# Patient Record
Sex: Female | Born: 1955 | Race: White | Hispanic: No | Marital: Married | Smoking: Never smoker
Health system: Southern US, Community
[De-identification: ages and names within clinical notes are randomized; demographics above are authoritative.]

---

## 2009-06-19 ENCOUNTER — Inpatient Hospital Stay: Payer: Self-pay | Admitting: *Deleted

## 2016-08-22 ENCOUNTER — Emergency Department: Payer: BLUE CROSS/BLUE SHIELD

## 2016-08-22 ENCOUNTER — Inpatient Hospital Stay
Admission: EM | Admit: 2016-08-22 | Discharge: 2016-08-25 | DRG: 066 | Disposition: A | Discharge status: Home / Self Care | Payer: BLUE CROSS/BLUE SHIELD | Attending: Internal Medicine | Admitting: Internal Medicine

## 2016-08-22 DIAGNOSIS — I10 Essential (primary) hypertension: Secondary | ICD-10-CM | POA: Diagnosis present

## 2016-08-22 DIAGNOSIS — R531 Weakness: Secondary | ICD-10-CM | POA: Diagnosis present

## 2016-08-22 DIAGNOSIS — E785 Hyperlipidemia, unspecified: Secondary | ICD-10-CM | POA: Diagnosis present

## 2016-08-22 DIAGNOSIS — Z823 Family history of stroke: Secondary | ICD-10-CM | POA: Diagnosis not present

## 2016-08-22 DIAGNOSIS — R29700 NIHSS score 0: Secondary | ICD-10-CM | POA: Diagnosis present

## 2016-08-22 DIAGNOSIS — Z885 Allergy status to narcotic agent status: Secondary | ICD-10-CM | POA: Diagnosis not present

## 2016-08-22 DIAGNOSIS — I959 Hypotension, unspecified: Secondary | ICD-10-CM | POA: Diagnosis present

## 2016-08-22 DIAGNOSIS — Z7982 Long term (current) use of aspirin: Secondary | ICD-10-CM | POA: Diagnosis not present

## 2016-08-22 DIAGNOSIS — G8324 Monoplegia of upper limb affecting left nondominant side: Secondary | ICD-10-CM | POA: Diagnosis present

## 2016-08-22 DIAGNOSIS — M6281 Muscle weakness (generalized): Secondary | ICD-10-CM

## 2016-08-22 DIAGNOSIS — I639 Cerebral infarction, unspecified: Secondary | ICD-10-CM | POA: Diagnosis not present

## 2016-08-22 DIAGNOSIS — D72829 Elevated white blood cell count, unspecified: Secondary | ICD-10-CM | POA: Diagnosis present

## 2016-08-22 DIAGNOSIS — G459 Transient cerebral ischemic attack, unspecified: Secondary | ICD-10-CM

## 2016-08-22 DIAGNOSIS — I63411 Cerebral infarction due to embolism of right middle cerebral artery: Principal | ICD-10-CM | POA: Diagnosis present

## 2016-08-22 DIAGNOSIS — Z8249 Family history of ischemic heart disease and other diseases of the circulatory system: Secondary | ICD-10-CM | POA: Diagnosis not present

## 2016-08-22 DIAGNOSIS — Z8262 Family history of osteoporosis: Secondary | ICD-10-CM

## 2016-08-22 DIAGNOSIS — R739 Hyperglycemia, unspecified: Secondary | ICD-10-CM | POA: Diagnosis present

## 2016-08-22 DIAGNOSIS — E876 Hypokalemia: Secondary | ICD-10-CM | POA: Diagnosis present

## 2016-08-22 LAB — COMPREHENSIVE METABOLIC PANEL
ALT: 12 U/L — AB (ref 14–54)
AST: 19 U/L (ref 15–41)
Albumin: 4 g/dL (ref 3.5–5.0)
Alkaline Phosphatase: 68 U/L (ref 38–126)
Anion gap: 9 (ref 5–15)
BUN: 15 mg/dL (ref 6–20)
CHLORIDE: 103 mmol/L (ref 101–111)
CO2: 23 mmol/L (ref 22–32)
CREATININE: 0.87 mg/dL (ref 0.44–1.00)
Calcium: 9.1 mg/dL (ref 8.9–10.3)
Glucose, Bld: 136 mg/dL — ABNORMAL HIGH (ref 65–99)
POTASSIUM: 3.2 mmol/L — AB (ref 3.5–5.1)
Sodium: 135 mmol/L (ref 135–145)
TOTAL PROTEIN: 7.7 g/dL (ref 6.5–8.1)
Total Bilirubin: 1 mg/dL (ref 0.3–1.2)

## 2016-08-22 LAB — CBC WITH DIFFERENTIAL/PLATELET
BASOS ABS: 0.1 10*3/uL (ref 0–0.1)
Basophils Relative: 1 %
EOS ABS: 0.1 10*3/uL (ref 0–0.7)
Eosinophils Relative: 1 %
HCT: 36.8 % (ref 35.0–47.0)
HEMOGLOBIN: 12.6 g/dL (ref 12.0–16.0)
LYMPHS ABS: 2.2 10*3/uL (ref 1.0–3.6)
Lymphocytes Relative: 17 %
MCH: 29 pg (ref 26.0–34.0)
MCHC: 34.2 g/dL (ref 32.0–36.0)
MCV: 84.6 fL (ref 80.0–100.0)
Monocytes Absolute: 1.1 10*3/uL — ABNORMAL HIGH (ref 0.2–0.9)
Monocytes Relative: 9 %
NEUTROS PCT: 72 %
Neutro Abs: 9.1 10*3/uL — ABNORMAL HIGH (ref 1.4–6.5)
Platelets: 246 10*3/uL (ref 150–440)
RBC: 4.35 MIL/uL (ref 3.80–5.20)
RDW: 13.3 % (ref 11.5–14.5)
WBC: 12.6 10*3/uL — AB (ref 3.6–11.0)

## 2016-08-22 LAB — TROPONIN I
TROPONIN I: 1.02 ng/mL — AB (ref <0.03)
Troponin I: 0.03 ng/mL (ref <0.03)
Troponin I: 0.3 ng/mL (ref <0.03)
Troponin I: 0.89 ng/mL (ref <0.03)

## 2016-08-22 LAB — APTT: APTT: 31 s (ref 24–36)

## 2016-08-22 LAB — URINALYSIS, ROUTINE W REFLEX MICROSCOPIC
BILIRUBIN URINE: NEGATIVE
Glucose, UA: NEGATIVE mg/dL
HGB URINE DIPSTICK: NEGATIVE
KETONES UR: 20 mg/dL — AB
Leukocytes, UA: NEGATIVE
NITRITE: NEGATIVE
PROTEIN: NEGATIVE mg/dL
SPECIFIC GRAVITY, URINE: 1.02 (ref 1.005–1.030)
pH: 5 (ref 5.0–8.0)

## 2016-08-22 LAB — URINE DRUG SCREEN, QUALITATIVE (ARMC ONLY)
Amphetamines, Ur Screen: NOT DETECTED
BARBITURATES, UR SCREEN: NOT DETECTED
BENZODIAZEPINE, UR SCRN: NOT DETECTED
CANNABINOID 50 NG, UR ~~LOC~~: NOT DETECTED
Cocaine Metabolite,Ur ~~LOC~~: NOT DETECTED
MDMA (Ecstasy)Ur Screen: NOT DETECTED
METHADONE SCREEN, URINE: NOT DETECTED
Opiate, Ur Screen: NOT DETECTED
Phencyclidine (PCP) Ur S: NOT DETECTED
TRICYCLIC, UR SCREEN: NOT DETECTED

## 2016-08-22 LAB — TSH: TSH: 2.233 u[IU]/mL (ref 0.350–4.500)

## 2016-08-22 LAB — PROTIME-INR
INR: 1.08
PROTHROMBIN TIME: 14 s (ref 11.4–15.2)

## 2016-08-22 MED ORDER — CLOPIDOGREL BISULFATE 75 MG PO TABS
75.0000 mg | ORAL_TABLET | Freq: Every day | ORAL | Status: DC
  Administered 2016-08-22 – 2016-08-25 (×4): 75 mg via ORAL
  Filled 2016-08-22 (×4): qty 1

## 2016-08-22 MED ORDER — ASPIRIN 81 MG PO CHEW
324.0000 mg | CHEWABLE_TABLET | Freq: Once | ORAL | Status: AC
  Administered 2016-08-22: 324 mg via ORAL
  Filled 2016-08-22: qty 4

## 2016-08-22 MED ORDER — ENOXAPARIN SODIUM 40 MG/0.4ML ~~LOC~~ SOLN
40.0000 mg | SUBCUTANEOUS | Status: DC
  Administered 2016-08-22: 40 mg via SUBCUTANEOUS
  Filled 2016-08-22 (×3): qty 0.4

## 2016-08-22 MED ORDER — VITAMIN D 1000 UNITS PO TABS
2000.0000 [IU] | ORAL_TABLET | Freq: Every day | ORAL | Status: DC
  Administered 2016-08-22 – 2016-08-25 (×4): 2000 [IU] via ORAL
  Filled 2016-08-22 (×4): qty 2

## 2016-08-22 MED ORDER — METOPROLOL TARTRATE 25 MG PO TABS
12.5000 mg | ORAL_TABLET | Freq: Two times a day (BID) | ORAL | Status: DC
  Administered 2016-08-22 – 2016-08-24 (×6): 12.5 mg via ORAL
  Filled 2016-08-22 (×7): qty 1

## 2016-08-22 MED ORDER — POTASSIUM CHLORIDE IN NACL 20-0.9 MEQ/L-% IV SOLN
INTRAVENOUS | Status: DC
  Administered 2016-08-22 – 2016-08-23 (×3): via INTRAVENOUS
  Filled 2016-08-22 (×5): qty 1000

## 2016-08-22 MED ORDER — ONDANSETRON HCL 4 MG PO TABS: 4.0000 mg | ORAL_TABLET | Freq: Four times a day (QID) | ORAL | Status: DC | PRN

## 2016-08-22 MED ORDER — ONDANSETRON HCL 4 MG/2ML IJ SOLN: 4.0000 mg | Freq: Four times a day (QID) | INTRAMUSCULAR | Status: DC | PRN

## 2016-08-22 MED ORDER — OXYCODONE-ACETAMINOPHEN 5-325 MG PO TABS
ORAL_TABLET | ORAL | Status: AC
  Filled 2016-08-22: qty 1

## 2016-08-22 MED ORDER — ASPIRIN EC 325 MG PO TBEC
325.0000 mg | DELAYED_RELEASE_TABLET | Freq: Every day | ORAL | Status: DC
  Administered 2016-08-23 – 2016-08-25 (×3): 325 mg via ORAL
  Filled 2016-08-22 (×3): qty 1

## 2016-08-22 MED ORDER — ACETAMINOPHEN 325 MG PO TABS
650.0000 mg | ORAL_TABLET | Freq: Once | ORAL | Status: AC
  Administered 2016-08-22: 650 mg via ORAL
  Filled 2016-08-22: qty 2

## 2016-08-22 MED ORDER — ATORVASTATIN CALCIUM 20 MG PO TABS
40.0000 mg | ORAL_TABLET | Freq: Every day | ORAL | Status: DC
  Administered 2016-08-23 – 2016-08-24 (×2): 40 mg via ORAL
  Filled 2016-08-22 (×2): qty 2

## 2016-08-22 MED ORDER — DOCUSATE SODIUM 100 MG PO CAPS
100.0000 mg | ORAL_CAPSULE | Freq: Two times a day (BID) | ORAL | Status: DC
  Administered 2016-08-23 – 2016-08-25 (×4): 100 mg via ORAL
  Filled 2016-08-22 (×5): qty 1

## 2016-08-22 MED ORDER — SODIUM CHLORIDE 0.9 % IV SOLN
1000.0000 mL | Freq: Once | INTRAVENOUS | Status: AC
  Administered 2016-08-22: 1000 mL via INTRAVENOUS

## 2016-08-22 MED ORDER — SODIUM CHLORIDE 0.9% FLUSH
3.0000 mL | Freq: Two times a day (BID) | INTRAVENOUS | Status: DC
  Administered 2016-08-22 – 2016-08-25 (×5): 3 mL via INTRAVENOUS

## 2016-08-22 NOTE — ED Notes (Signed)
Patient assisted to use restroom. Able to ambulate without one assist. Steady gait.

## 2016-08-22 NOTE — H&P (Signed)
[facility] - [place] at [facility]   PATIENT NAME: Sophia Taylor    MR#:  [id number]  DATE OF BIRTH:  October 24, 1955  DATE OF ADMISSION:  08/22/2016  PRIMARY CARE PHYSICIAN: Doristine Bosworth, MD   REQUESTING/REFERRING PHYSICIAN:   CHIEF COMPLAINT:   Chief Complaint  Patient presents with  . Extremity Weakness  . Code Stroke    HISTORY OF PRESENT ILLNESS: Sophia Taylor  is a 61 y.o. female with a known history of Stroke with left lower extremity weakness and just on 10, which completely resolved, on aspirin therapy, hypertension, hyperlipidemia, who presents to the hospital with complaints of left upper extremity weakness and numbness. I went to the patient she was doing well early in the morning. She was about to go to the shower, however, while walking into the shower stall. She noted that her left upper extremity is not moving well. He felt weak and numb. Patient denied any visual symptoms. No dysphagia, no facial or left lower extremity numbness or weakness. She presented to the hospital for further evaluation and treatment, she continues to have some left upper extremity weakness. Hospitalist services were contacted for admission for stroke. Patient was seen by neurologist, who recommended to continue aspirin and Plavix therapy, get MRI, MRA of the brain and other studies. CT scan of the head in the emergency room was unremarkable  PAST MEDICAL HISTORY:  Hypertension, hyperlipidemia, stroke with no residuals, on aspirin therapy, ganglion  PAST SURGICAL HISTORY: Hernia repair in 2008, ventral hernia, right lower quadrant tumor in 2007, benign  SOCIAL HISTORY:  Social History  Substance Use Topics  . Smoking status: Never Smoker  . Smokeless tobacco: Never Used  . Alcohol use No    FAMILY HISTORY: No early coronary artery disease .  DRUG ALLERGIES:  Allergies  Allergen Reactions  . Codeine Nausea And Vomiting    Review of Systems  Constitutional:  Negative for chills, fever and weight loss.  HENT: Negative for congestion.   Eyes: Negative for blurred vision and double vision.  Respiratory: Negative for cough, sputum production, shortness of breath and wheezing.   Cardiovascular: Negative for chest pain, palpitations, orthopnea, leg swelling and PND.  Gastrointestinal: Negative for abdominal pain, blood in stool, constipation, diarrhea, nausea and vomiting.  Genitourinary: Negative for dysuria, frequency, hematuria and urgency.  Musculoskeletal: Negative for falls.  Neurological: Positive for sensory change and focal weakness. Negative for dizziness, tremors and headaches.  Endo/Heme/Allergies: Does not bruise/bleed easily.  Psychiatric/Behavioral: Negative for depression. The patient does not have insomnia.     MEDICATIONS AT HOME:  Prior to Admission medications   Medication Sig Start Date End Date Taking? Authorizing Provider  amLODipine (NORVASC) 10 MG tablet Take 10 mg by mouth daily.   Yes Historical Provider, MD  aspirin EC 81 MG tablet Take 81 mg by mouth daily.   Yes Historical Provider, MD  Cholecalciferol (VITAMIN D) 2000 units CAPS Take 2,000 Units by mouth daily.   Yes Historical Provider, MD  lisinopril (PRINIVIL,ZESTRIL) 10 MG tablet Take 10 mg by mouth daily.   Yes Historical Provider, MD      PHYSICAL EXAMINATION:   VITAL SIGNS: Pulse (!) 108, temperature 98.2 F (36.8 C), height 4\' 11"  (1.499 m), weight 68 kg (150 lb).  GENERAL:  61 y.o.-year-old patient lying in the bed with no acute distress.  EYES: Pupils equal, round, reactive to light and accommodation. No scleral icterus. Extraocular muscles intact.  HEENT: Head atraumatic, normocephalic. Oropharynx and nasopharynx  clear.  NECK:  Supple, no jugular venous distention. No thyroid enlargement, no tenderness.  LUNGS: Normal breath sounds bilaterally, no wheezing, rales,rhonchi or crepitation. No use of accessory muscles of respiration.  CARDIOVASCULAR: S1,  S2 normal. No murmurs, rubs, or gallops.  ABDOMEN: Soft, nontender, nondistended. Bowel sounds present. No organomegaly or mass.  EXTREMITIES: No pedal edema, cyanosis, or clubbing.  NEUROLOGIC: Cranial nerves II through XII are intact. Muscle strength 5/5 in 3 extremities, but left upper extremity which is 4 out of 5. Sensation intact. Gait not checked.  PSYCHIATRIC: The patient is alert and oriented x 3.  SKIN: No obvious rash, lesion, or ulcer.   LABORATORY PANEL:   CBC  Recent Labs Lab 08/22/16 1025  WBC 12.6*  HGB 12.6  HCT 36.8  PLT 246  MCV 84.6  MCH 29.0  MCHC 34.2  RDW 13.3  LYMPHSABS 2.2  MONOABS 1.1*  EOSABS 0.1  BASOSABS 0.1   ------------------------------------------------------------------------------------------------------------------  Chemistries   Recent Labs Lab 08/22/16 1025  NA 135  K 3.2*  CL 103  CO2 23  GLUCOSE 136*  BUN 15  CREATININE 0.87  CALCIUM 9.1  AST 19  ALT 12*  ALKPHOS 68  BILITOT 1.0   ------------------------------------------------------------------------------------------------------------------  Cardiac Enzymes  Recent Labs Lab 08/22/16 1025  TROPONINI 0.03*   ------------------------------------------------------------------------------------------------------------------  RADIOLOGY: Ct Head Wo Contrast  Result Date: 08/22/2016 CLINICAL DATA:  Code stroke.  Left arm weakness EXAM: CT HEAD WITHOUT CONTRAST TECHNIQUE: Contiguous axial images were obtained from the base of the skull through the vertex without intravenous contrast. COMPARISON:  06/19/2009 FINDINGS: Brain: No evidence of acute infarction, hemorrhage, hydrocephalus, extra-axial collection or mass lesion/mass effect. Mild white matter disease without detectable progression since prior. Superficial high-density along the ventral lower pons is attributed artifact based on reformats. Vascular: No hyperdense vessel. Atherosclerotic calcification. Tortuous  vertebrobasilar system. Skull: Negative Sinuses/Orbits: Negative Other: These results were called by telephone at the time of interpretation on 08/22/2016 at 10:38 am to Dr. Jene Every , who verbally acknowledged these results. ASPECTS [facility] Stroke Program Early CT Score) - Ganglionic level infarction (caudate, lentiform nuclei, internal capsule, insula, M1-M3 cortex): 7 - Supraganglionic infarction (M4-M6 cortex): 3 Total score (0-10 with 10 being normal): 10 IMPRESSION: No acute finding. ASPECTS is 10. Electronically Signed   By: Marnee Spring M.D.   On: 08/22/2016 10:41    EKG: Orders placed or performed during the hospital encounter of 08/22/16  . ED EKG  . ED EKG  . EKG 12-Lead  . EKG 12-Lead  . EKG 12-Lead  . EKG 12-Lead  EKG in emergency room revealed sinus tachycardia at rate of 109, normal axis, no acute ST-T changes  IMPRESSION AND PLAN:  Active Problems:   CVA (cerebral vascular accident) (HCC)  #1. Left upper extremity weakness due to acute stroke, admit the patient to the  medical floor, appreciate neurology's input, continue aspirin and Plavix, get MRI, MRA of the brain, carotid ultrasound, echocardiogram, TSH, lipid panel, hemoglobin A1c, PT, OT, SLP evaluations #2. Relative hypotension, holding blood pressure medications #3. Hyperlipidemia, change Zocor to Lipitor, check lipid panel in the morning #4. Hypokalemia, supplement intravenously, get magnesium level checked #5. Elevated troponin, unremarkable EKG, get echocardiogram, initiate patient on low dose of metoprolol, continue aspirin and Plavix and Lipitor, may need to get cardiologist involved, no chest pains were reported #6. Leukocytosis, likely stress related, no obvious infection #7. Hyperglycemia, get hemoglobin A1c to rule out diabetes   All the records are reviewed and  case discussed with ED provider. Management plans discussed with the patient, family and they are in agreement.  CODE STATUS: Code  Status History    This patient does not have a recorded code status. Please follow your organizational policy for patients in this situation.       TOTAL TIME TAKING CARE OF THIS PATIENT: 50 minutes.    Katharina CaperVAICKUTE,Jewelene Mairena M.D on 08/22/2016 at 1:36 PM  Between 7am to 6pm - Pager - [phone] After 6pm go to [email or web address] - password [email or web address]  [facility]  Office  [phone]  CC: Primary care physician; Doristine BosworthMark D Miller, MD

## 2016-08-22 NOTE — ED Triage Notes (Signed)
Pt arrives via [facility] from home with reports of left arm "heaviness"  Pt reports awakening like normal today and feeling herself but then she got into the shower and she began having left arm heaviness  Hx CVA

## 2016-08-22 NOTE — Progress Notes (Signed)
CH responding to a PG visited a code stroke Pt in Rm19. Pt had been take to CT scan, but the Husband and son were waiting int he Rm. CH talked to the family members and asked them if they needed anything, they said no. CH told the family he is available if needed to provide pastoral care.     08/22/16 1300  Clinical Encounter Type  Visited With Patient;Patient and family together  Visit Type Initial;Spiritual support;Trauma;Other (Comment)  Referral From Nurse  Consult/Referral To Chaplain  Spiritual Encounters  Spiritual Needs Prayer;Other (Comment)

## 2016-08-22 NOTE — Consult Note (Signed)
Referring Physician: Cyril Loosen    Chief Complaint: Left sided weakness  HPI: Sophia Taylor is an 61 y.o. female who repots awakening at baseline today.  Sophia Taylor to take a shower and while in the shower began to noted heaviness and incoordination of her left side.  Patient continued to be able to walk and was able to get out of the shower without assistance.  Noted some dizziness as well.  Patient presented for evaluation.  Has a history of stroke on ASA.   Initial NIHSS of 0.  No residual deficits from prior infarct.    Date last known well: Date: 08/22/2016 Time last known well: Time: 09:00 tPA Given: No: Improving, minor symptoms  Past medical history: HTN, hyperlipidemia, stroke (2010)  Past surgical history: hernia repair, RLQ tumor removal  Family history: Father deceased with a stroke.  Mother with CAD s/p MI and osteoporosis  Social History:  reports that she has never smoked. She has never used smokeless tobacco. She reports that she does not drink alcohol. Her drug history is not on file.  Allergies:  Allergies  Allergen Reactions  . Codeine Nausea And Vomiting    Medications: I have reviewed the patient's current medications. Prior to Admission:  Prior to Admission medications   Medication Sig Start Date End Date Taking? Authorizing Provider  amLODipine (NORVASC) 10 MG tablet Take 10 mg by mouth daily.   Yes Historical Provider, MD  aspirin EC 81 MG tablet Take 81 mg by mouth daily.   Yes Historical Provider, MD  Cholecalciferol (VITAMIN D) 2000 units CAPS Take 2,000 Units by mouth daily.   Yes Historical Provider, MD  lisinopril (PRINIVIL,ZESTRIL) 10 MG tablet Take 10 mg by mouth daily.   Yes Historical Provider, MD     ROS: History obtained from the patient  General ROS: negative for - chills, fatigue, fever, night sweats, weight gain or weight loss Psychological ROS: negative for - behavioral disorder, hallucinations, memory difficulties, mood swings or suicidal  ideation Ophthalmic ROS: negative for - blurry vision, double vision, eye pain or loss of vision ENT ROS: negative for - epistaxis, nasal discharge, oral lesions, sore throat, tinnitus or vertigo Allergy and Immunology ROS: negative for - hives or itchy/watery eyes Hematological and Lymphatic ROS: negative for - bleeding problems, bruising or swollen lymph nodes Endocrine ROS: negative for - galactorrhea, hair pattern changes, polydipsia/polyuria or temperature intolerance Respiratory ROS: negative for - cough, hemoptysis, shortness of breath or wheezing Cardiovascular ROS: negative for - chest pain, dyspnea on exertion, edema or irregular heartbeat Gastrointestinal ROS: negative for - abdominal pain, diarrhea, hematemesis, nausea/vomiting or stool incontinence Genito-Urinary ROS: negative for - dysuria, hematuria, incontinence or urinary frequency/urgency Musculoskeletal ROS: negative for - joint swelling or muscular weakness Neurological ROS: as noted in HPI Dermatological ROS: negative for rash and skin lesion changes  Physical Examination: Pulse (!) 108, temperature 98.2 F (36.8 C), height 4\' 11"  (1.499 m), weight 68 kg (150 lb).  HEENT-  Normocephalic, no lesions, without obvious abnormality.  Normal external eye and conjunctiva.  Normal TM's bilaterally.  Normal auditory canals and external ears. Normal external nose, mucus membranes and septum.  Normal pharynx. Cardiovascular- S1, S2 normal, pulses palpable throughout   Lungs- chest clear, no wheezing, rales, normal symmetric air entry Abdomen- soft, non-tender; bowel sounds normal; no masses,  no organomegaly Extremities- no edema Lymph-no adenopathy palpable Musculoskeletal-no joint tenderness, deformity or swelling Skin-warm and dry, no hyperpigmentation, vitiligo, or suspicious lesions  Neurological Examination Mental Status: Alert, oriented,  thought content appropriate.  Speech fluent without evidence of aphasia.  Able to  follow 3 step commands without difficulty. Cranial Nerves: II: Discs flat bilaterally; Visual fields grossly normal, pupils equal, round, reactive to light and accommodation III,IV, VI: ptosis not present, extra-ocular motions intact bilaterally V,VII: smile symmetric, facial light touch sensation normal bilaterally VIII: hearing normal bilaterally IX,X: gag reflex present XI: bilateral shoulder shrug XII: midline tongue extension Motor: Right : Upper extremity   5/5    Left:     Upper extremity   5/5  Lower extremity   5/5     Lower extremity   5/5 Tone and bulk:normal tone throughout; no atrophy noted Sensory: Pinprick and light touch intact throughout, bilaterally Deep Tendon Reflexes: 2+ and symmetric with absent AJ's bilaterally Plantars: Right: downgoing   Left: downgoing Cerebellar: Normal finger-to-nose and normal heel-to-shin testing bilaterally Gait: not tested due to safety concerns    Laboratory Studies:  Basic Metabolic Panel:  Recent Labs Lab 08/22/16 1025  NA 135  K 3.2*  CL 103  CO2 23  GLUCOSE 136*  BUN 15  CREATININE 0.87  CALCIUM 9.1    Liver Function Tests:  Recent Labs Lab 08/22/16 1025  AST 19  ALT 12*  ALKPHOS 68  BILITOT 1.0  PROT 7.7  ALBUMIN 4.0   No results for input(s): LIPASE, AMYLASE in the last 168 hours. No results for input(s): AMMONIA in the last 168 hours.  CBC:  Recent Labs Lab 08/22/16 1025  WBC 12.6*  NEUTROABS 9.1*  HGB 12.6  HCT 36.8  MCV 84.6  PLT 246    Cardiac Enzymes:  Recent Labs Lab 08/22/16 1025  TROPONINI 0.03*    BNP: Invalid input(s): POCBNP  CBG: No results for input(s): GLUCAP in the last 168 hours.  Microbiology: No results found for this or any previous visit.  Coagulation Studies:  Recent Labs  08/22/16 1025  LABPROT 14.0  INR 1.08    Urinalysis:  Recent Labs Lab 08/22/16 1204  COLORURINE YELLOW*  LABSPEC 1.020  PHURINE 5.0  GLUCOSEU NEGATIVE  HGBUR NEGATIVE   BILIRUBINUR NEGATIVE  KETONESUR 20*  PROTEINUR NEGATIVE  NITRITE NEGATIVE  LEUKOCYTESUR NEGATIVE    Lipid Panel: No results found for: CHOL, TRIG, HDL, CHOLHDL, VLDL, LDLCALC  HgbA1C: No results found for: HGBA1C  Urine Drug Screen:     Component Value Date/Time   LABOPIA NONE DETECTED 08/22/2016 1204   COCAINSCRNUR NONE DETECTED 08/22/2016 1204   LABBENZ NONE DETECTED 08/22/2016 1204   AMPHETMU NONE DETECTED 08/22/2016 1204   THCU NONE DETECTED 08/22/2016 1204   LABBARB NONE DETECTED 08/22/2016 1204    Alcohol Level: No results for input(s): ETH in the last 168 hours.  Other results: EKG: sinus tachycardia at 109 bpm.  Imaging: Ct Head Wo Contrast  Result Date: 08/22/2016 CLINICAL DATA:  Code stroke.  Left arm weakness EXAM: CT HEAD WITHOUT CONTRAST TECHNIQUE: Contiguous axial images were obtained from the base of the skull through the vertex without intravenous contrast. COMPARISON:  06/19/2009 FINDINGS: Brain: No evidence of acute infarction, hemorrhage, hydrocephalus, extra-axial collection or mass lesion/mass effect. Mild white matter disease without detectable progression since prior. Superficial high-density along the ventral lower pons is attributed artifact based on reformats. Vascular: No hyperdense vessel. Atherosclerotic calcification. Tortuous vertebrobasilar system. Skull: Negative Sinuses/Orbits: Negative Other: These results were called by telephone at the time of interpretation on 08/22/2016 at 10:38 am to Dr. Jene Every , who verbally acknowledged these results. ASPECTS [facility] Stroke Program Early  CT Score) - Ganglionic level infarction (caudate, lentiform nuclei, internal capsule, insula, M1-M3 cortex): 7 - Supraganglionic infarction (M4-M6 cortex): 3 Total score (0-10 with 10 being normal): 10 IMPRESSION: No acute finding. ASPECTS is 10. Electronically Signed   By: Marnee SpringJonathon  Watts M.D.   On: 08/22/2016 10:41    Assessment: 61 y.o. female with a history of  stroke on ASA who presents with difficulty controlling her left side.  Symptoms have improved since onset although the patient does report that her left arm "feels funny".  Head CT reviewed and shows no acute changes.  Further work up recommended.    Stroke Risk Factors - hyperlipidemia and hypertension  Plan: 1. HgbA1c, fasting lipid panel 2. MRI, MRA  of the brain without contrast 3. PT consult, OT consult, Speech consult 4. Echocardiogram 5. Carotid dopplers 6. Prophylactic therapy-Antiplatelet med: Aspirin - dose 325mg  daily and Plavix 75mg  daily 7. NPO until RN stroke swallow screen 8. Telemetry monitoring 9. Frequent neuro checks 10. 500cc NS bolus  Case discussed with Dr. Geronimo BootKinner  Norville Dani, MD Neurology [phone] 08/22/2016, 12:52 PM

## 2016-08-22 NOTE — ED Notes (Signed)
Patient given dinner tray and drink. Complaining of headache. Will speak with admitting MD regarding orders.

## 2016-08-22 NOTE — ED Provider Notes (Addendum)
[facility] Emergency Department Provider Note   ____________________________________________    I have reviewed the triage vital signs and the nursing notes.   HISTORY  Chief Complaint Extremity Weakness and Code Stroke     HPI Sophia Taylor is a 61 y.o. female who presents with complaints of left arm "heaviness". Patient notes she was in the shower this morning, felt weird and then developed a sensation of heaviness in her left arm. She does report a distant history of this stroke but never had any deficits from it. She denies head injury. No nausea or vomiting. No blood thinners.   No past medical history on file.  There are no active problems to display for this patient.   No past surgical history on file.  Prior to Admission medications   Not on File     Allergies Patient has no known allergies.  No family history on file.  Social History Social History  Substance Use Topics  . Smoking status: Never Smoker  . Smokeless tobacco: Never Used  . Alcohol use No    Review of Systems  Constitutional: No fever/chills Eyes: No visual changes.  ENT: No Neck pain Cardiovascular: Denies chest pain. Respiratory: Denies shortness of breath. Gastrointestinal:  No nausea, no vomiting.   Genitourinary: Negative for dysuria. Musculoskeletal: No myalgias Skin: Negative for rash. Neurological: Negative for headaches   10-point ROS otherwise negative.  ____________________________________________   PHYSICAL EXAM:  VITAL SIGNS: ED Triage Vitals [08/22/16 1024]  Enc Vitals Group     BP      Pulse Rate (!) 108     Resp      Temp 99 F (37.2 C)     Temp Source Oral     SpO2      Weight 150 lb (68 kg)     Height 4\' 11"  (1.499 m)     Head Circumference      Peak Flow      Pain Score      Pain Loc      Pain Edu?      Excl. in GC?     Constitutional: Alert and oriented. No acute distress. Pleasant and interactive Eyes:  Conjunctivae are normal.   Nose: No congestion/rhinnorhea. Mouth/Throat: Mucous membranes are moist.   Neck:  Painless ROM Cardiovascular: Normal rate, regular rhythm. Grossly normal heart sounds.  Good peripheral circulation. Respiratory: Normal respiratory effort.  No retractions. Lungs CTAB. Gastrointestinal: Soft and nontender. No distention.  No CVA tenderness. Genitourinary: deferred Musculoskeletal:   Warm and well perfused Neurologic:  Normal speech and language. No gross focal neurologic deficits are appreciated. Strength appears normal in all extremities. Cranial nerves to 12 are normal, PERRLA, EOMI, NIH scale 0 Skin:  Skin is warm, dry and intact. No rash noted. Psychiatric: Mood and affect are normal. Speech and behavior are normal.  ____________________________________________   LABS (all labs ordered are listed, but only abnormal results are displayed)  Labs Reviewed  CBC WITH DIFFERENTIAL/PLATELET - Abnormal; Notable for the following:       Result Value   WBC 12.6 (*)    Neutro Abs 9.1 (*)    Monocytes Absolute 1.1 (*)    All other components within normal limits  APTT  COMPREHENSIVE METABOLIC PANEL  TROPONIN I  PROTIME-INR  URINALYSIS, ROUTINE W REFLEX MICROSCOPIC  URINE DRUG SCREEN, QUALITATIVE ([facility] ONLY)   ____________________________________________  EKG  ED ECG REPORT I, Jene EveryKINNER, Muneeb Veras, the attending physician, personally viewed and interpreted  this ECG.  Date: 08/22/2016 EKG Time: 10:23 AM Rate:109 Rhythm: Sinus tachycardia QRS Axis: normal Intervals: normal ST/T Wave abnormalities: normal   ____________________________________________  RADIOLOGY  CT head unremarkable ____________________________________________   PROCEDURES  Procedure(s) performed: No    Critical Care performed:yes  CRITICAL CARE Performed by: Jene Every   Total critical care time: 30 minutes  Critical care time was exclusive of separately billable  procedures and treating other patients.  Critical care was necessary to treat or prevent imminent or life-threatening deterioration.  Critical care was time spent personally by me on the following activities: development of treatment plan with patient and/or surrogate as well as nursing, discussions with consultants, evaluation of patient's response to treatment, examination of patient, obtaining history from patient or surrogate, ordering and performing treatments and interventions, ordering and review of laboratory studies, ordering and review of radiographic studies, pulse oximetry and re-evaluation of patient's condition. ____________________________________________   INITIAL IMPRESSION / ASSESSMENT AND PLAN / ED COURSE  Pertinent labs & imaging results that were available during my care of the patient were reviewed by me and considered in my medical decision making (see chart for details).  Code stroke called upon patient arrival as sx began <1 hour pta, seen by neurologist team, we discussed symptoms and we agree that we should not giveTPA, given NIH stroke scale 0. Neuro Recommends aspirin and admission for further workup    ____________________________________________   FINAL CLINICAL IMPRESSION(S) / ED DIAGNOSES  Final diagnoses:  Cerebrovascular accident (CVA), unspecified mechanism (HCC)      NEW MEDICATIONS STARTED DURING THIS VISIT:  New Prescriptions   No medications on file     Note:  This document was prepared using Dragon voice recognition software and may include unintentional dictation errors.    Jene Every, MD 08/22/16 1228    Jene Every, MD 08/22/16 1230

## 2016-08-23 ENCOUNTER — Inpatient Hospital Stay: Payer: BLUE CROSS/BLUE SHIELD

## 2016-08-23 ENCOUNTER — Inpatient Hospital Stay
Admit: 2016-08-23 | Discharge: 2016-08-23 | Disposition: A | Discharge status: Home / Self Care | Payer: BLUE CROSS/BLUE SHIELD | Attending: Internal Medicine | Admitting: Internal Medicine

## 2016-08-23 DIAGNOSIS — I639 Cerebral infarction, unspecified: Secondary | ICD-10-CM

## 2016-08-23 LAB — LIPID PANEL
Cholesterol: 177 mg/dL (ref 0–200)
HDL: 46 mg/dL (ref >40)
LDL Cholesterol: 112 mg/dL — ABNORMAL HIGH (ref 0–99)
Total CHOL/HDL Ratio: 3.8 RATIO
Triglycerides: 96 mg/dL (ref <150)
VLDL: 19 mg/dL (ref 0–40)

## 2016-08-23 LAB — BASIC METABOLIC PANEL
ANION GAP: 7 (ref 5–15)
BUN: 12 mg/dL (ref 6–20)
CALCIUM: 8.9 mg/dL (ref 8.9–10.3)
CO2: 23 mmol/L (ref 22–32)
CREATININE: 0.65 mg/dL (ref 0.44–1.00)
Chloride: 111 mmol/L (ref 101–111)
GFR calc non Af Amer: >60 mL/min (ref >60)
Glucose, Bld: 99 mg/dL (ref 65–99)
Potassium: 4.2 mmol/L (ref 3.5–5.1)
Sodium: 141 mmol/L (ref 135–145)

## 2016-08-23 LAB — CBC
HCT: 36.3 % (ref 35.0–47.0)
HEMOGLOBIN: 12.1 g/dL (ref 12.0–16.0)
MCH: 28.3 pg (ref 26.0–34.0)
MCHC: 33.2 g/dL (ref 32.0–36.0)
MCV: 85.2 fL (ref 80.0–100.0)
PLATELETS: 262 10*3/uL (ref 150–440)
RBC: 4.26 MIL/uL (ref 3.80–5.20)
RDW: 13.4 % (ref 11.5–14.5)
WBC: 9.9 10*3/uL (ref 3.6–11.0)

## 2016-08-23 LAB — GLUCOSE, CAPILLARY: Glucose-Capillary: 98 mg/dL (ref 65–99)

## 2016-08-23 MED ORDER — STROKE: EARLY STAGES OF RECOVERY BOOK
Freq: Once | Status: AC
  Administered 2016-08-22: 22:00:00

## 2016-08-23 NOTE — Progress Notes (Signed)
OT Cancellation Note  Patient Details Name: Sophia Taylor MRN: [id number] DOB: [birth year]   Cancelled Treatment:    Reason Eval/Treat Not Completed: Medical issues which prohibited therapy (Pt. with elevated Troponin level trending up. OT services are contraindicated at this time. Will monitor, and eval when appropriate.Marland Kitchen.Olegario Messier)  Froilan Mclean, MS, OTR/L 08/23/2016, 9:44 AM

## 2016-08-23 NOTE — Evaluation (Signed)
Clinical/Bedside Swallow Evaluation Patient Details  Name: Sophia Taylor MRN: [id number] Date of Birth: 1955/10/10  Today's Date: 08/23/2016 Time: SLP Start Time (ACUTE ONLY): 0803 SLP Stop Time (ACUTE ONLY): 0850 SLP Time Calculation (min) (ACUTE ONLY): 47 min  Past Medical History: History reviewed. No pertinent past medical history. Past Surgical History: History reviewed. No pertinent surgical history. HPI:  Pt is a 61 y.o. female with a known history of HTN, hyperlipodemia, Stroke with left lower extremity weakness which completely resolved, on aspirin therapy, who presents to the hospital with complaints of left upper extremity weakness and numbness. I went to the patient she was doing well early in the morning. She was about to go to the shower, however, while walking into the shower stall. She noted that her left upper extremity is not moving well. She felt weak and numb. Patient denied any visual symptoms. No dysphagia, no facial or left lower extremity numbness or weakness. She presented to the hospital for further evaluation and treatment, she continues to have some left upper extremity weakness. Hospitalist services were contacted for admission for stroke. Currently, pt is A/O x3; verbally conversive and following all commands and conversation.    Assessment / Plan / Recommendation Clinical Impression  Pt appears to adequately tolerate trials of thin liquids and purees w/ no overt s/s of aspiration noted; no declined in vocal quality or respiratory status and no oral phase deficits noted. Held on solids d/t pending test this morning per NSG. Pt fed self stating her LUE was "stronger now". Pt appears at her baseline w/ swallowing and communication - pt conversed w/ SLP w/ no apparent language deficits during conversation, and pt denied such as well. No skilled ST services at this time. NSG to reconsult if any change in status while admitted. Pt agreed. NSG updated.     Aspiration  Risk   (reduced)    Diet Recommendation  regular diet w/ thin liquids; general aspiration precautions  Medication Administration: Whole meds with liquid    Other  Recommendations Recommended Consults:  (none) Oral Care Recommendations: Oral care BID;Patient independent with oral care   Follow up Recommendations None      Frequency and Duration  n/a         Prognosis Prognosis for Safe Diet Advancement: Good Barriers to Reach Goals:  (none)      Swallow Study   General Date of Onset: 08/22/16 HPI: Pt is a 61 y.o. female with a known history of HTN, hyperlipodemia, Stroke with left lower extremity weakness which completely resolved, on aspirin therapy, who presents to the hospital with complaints of left upper extremity weakness and numbness. I went to the patient she was doing well early in the morning. She was about to go to the shower, however, while walking into the shower stall. She noted that her left upper extremity is not moving well. She felt weak and numb. Patient denied any visual symptoms. No dysphagia, no facial or left lower extremity numbness or weakness. She presented to the hospital for further evaluation and treatment, she continues to have some left upper extremity weakness. Hospitalist services were contacted for admission for stroke. Currently, pt is A/O x3; verbally conversive and following all commands and conversation.  Type of Study: Bedside Swallow Evaluation Previous Swallow Assessment: none indicated Diet Prior to this Study: Regular;Thin liquids Temperature Spikes Noted: No (wbc not elevated) Respiratory Status: Room air History of Recent Intubation: No Behavior/Cognition: Alert;Cooperative;Pleasant mood Oral Cavity Assessment: Within Functional Limits Oral Care  Completed by SLP: Recent completion by staff Oral Cavity - Dentition: Adequate natural dentition Vision: Functional for self-feeding Self-Feeding Abilities: Able to feed self Patient  Positioning: Upright in bed Baseline Vocal Quality: Normal Volitional Cough: Strong Volitional Swallow: Able to elicit    Oral/Motor/Sensory Function Overall Oral Motor/Sensory Function: Within functional limits   Ice Chips Ice chips: Within functional limits Presentation: Spoon (fed; 3 trials)   Thin Liquid Thin Liquid: Within functional limits Presentation: Cup;Self Fed;Straw (6-7 trials)    Nectar Thick Nectar Thick Liquid: Not tested   Honey Thick Honey Thick Liquid: Not tested   Puree Puree: Within functional limits Presentation: Self Fed;Spoon (3-4 trials)   Solid   GO   Solid: Not tested Other Comments: holding on po's d/t pending test per NSG         Jerilynn SomKatherine Watson, MS, CCC-SLP Watson,Katherine 08/23/2016,4:48 PM

## 2016-08-23 NOTE — Progress Notes (Signed)
Pts Troponins trending up.  MD notified.  Waiting on response.

## 2016-08-23 NOTE — Progress Notes (Signed)
[facility] - [place] [facility]   PATIENT NAME: [place] Sirenlizabeth Swallows    MR#:  [id number]  DATE OF BIRTH:  January 12, 1956  SUBJECTIVE:  CHIEF COMPLAINT:   Chief Complaint  Patient presents with  . Extremity Weakness  . Code Stroke   Better left arm weakness. REVIEW OF SYSTEMS:  Review of Systems  Constitutional: Negative for chills and fever.  HENT: Negative for congestion and hearing loss.   Eyes: Negative for blurred vision, double vision and photophobia.  Respiratory: Negative for cough and shortness of breath.   Cardiovascular: Negative for chest pain and leg swelling.  Gastrointestinal: Negative for abdominal pain, blood in stool, diarrhea, melena, nausea and vomiting.  Genitourinary: Negative for dysuria and hematuria.  Musculoskeletal: Negative for back pain.  Skin: Negative for itching and rash.  Neurological: Positive for focal weakness. Negative for dizziness, tingling, sensory change, loss of consciousness, weakness and headaches.  Psychiatric/Behavioral: Negative for depression. The patient is not nervous/anxious.     DRUG ALLERGIES:   Allergies  Allergen Reactions  . Codeine Nausea And Vomiting   VITALS:  Blood pressure 131/65, pulse 80, temperature 99.8 F (37.7 C), temperature source Oral, resp. rate 18, height 4\' 11"  (1.499 m), weight 166 lb 9.6 oz (75.6 kg), SpO2 98 %. PHYSICAL EXAMINATION:  Physical Exam  Constitutional: She is oriented to person, place, and time and well-developed, well-nourished, and in no distress.  HENT:  Head: Normocephalic.  Mouth/Throat: Oropharynx is clear and moist.  Eyes: Conjunctivae and EOM are normal. Pupils are equal, round, and reactive to light.  Neck: Normal range of motion. Neck supple. No JVD present. No tracheal deviation present.  Cardiovascular: Normal rate, regular rhythm and normal heart sounds.  Exam reveals no gallop.   No murmur heard. Pulmonary/Chest: Effort normal and breath sounds normal.  No respiratory distress. She has no wheezes.  Abdominal: Soft. Bowel sounds are normal. She exhibits no distension. There is no tenderness.  Musculoskeletal: Normal range of motion. She exhibits no edema or tenderness.  Neurological: She is alert and oriented to person, place, and time. No cranial nerve deficit.  Mild left arm weakness  Skin: No rash noted. No erythema.  Psychiatric: Affect and judgment normal.   LABORATORY PANEL:   CBC  Recent Labs Lab 08/23/16 0502  WBC 9.9  HGB 12.1  HCT 36.3  PLT 262   ------------------------------------------------------------------------------------------------------------------ Chemistries   Recent Labs Lab 08/22/16 1025 08/23/16 0502  NA 135 141  K 3.2* 4.2  CL 103 111  CO2 23 23  GLUCOSE 136* 99  BUN 15 12  CREATININE 0.87 0.65  CALCIUM 9.1 8.9  AST 19  --   ALT 12*  --   ALKPHOS 68  --   BILITOT 1.0  --    RADIOLOGY:  Mr Brain Wo Contrast  Result Date: 08/23/2016 CLINICAL DATA:  Stroke and left lower extremity weakness. Left arm weakness. EXAM: MRI HEAD WITHOUT CONTRAST TECHNIQUE: Multiplanar, multiecho pulse sequences of the brain and surrounding structures were obtained without intravenous contrast. 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 There is acute infarction in the left medial thalamus measuring 1 x 1.5 cm. Small area of subacute infarction suspected in the left parietal subcortical white matter. 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 No evidence of hemorrhage, mass effect or shift. Brain elsewhere shows  mild chronic small-vessel ischemic change of the white matter. Vascular: Major vessels at the base of the brain show flow. 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 1 x 1.5 cm acute infarction in the medial left thalamus. Possible subacute white matter infarction in the left parietal lobe. 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 Electronically Signed   By: Paulina Fusi M.D.   On: 08/23/2016 11:41   US Carotid Bilateral  Result Date: 08/23/2016 CLINICAL DATA:  CVA. EXAM: BILATERAL CAROTID DUPLEX ULTRASOUND TECHNIQUE: Wallace Cullens scale imaging, color Doppler and duplex ultrasound were performed of bilateral carotid and vertebral arteries in the neck. COMPARISON:  CT 08/22/2016. FINDINGS: Criteria: Quantification of carotid stenosis is based on velocity parameters that correlate the residual internal carotid diameter with NASCET-based stenosis levels, using the diameter of the distal internal carotid lumen as the denominator for stenosis measurement. The following velocity measurements were obtained: RIGHT ICA:  84/34 cm/sec CCA:  137/24 cm/sec SYSTOLIC ICA/CCA RATIO:  0.6 DIASTOLIC ICA/CCA RATIO:  1.4 ECA:  145 cm/sec LEFT ICA:  126/31 cm/sec CCA:  113/20 cm/sec SYSTOLIC ICA/CCA RATIO:  1.1 DIASTOLIC ICA/CCA RATIO:  1.5 ECA:  129 cm/sec RIGHT CAROTID ARTERY: Mild right carotid bifurcation plaque. RIGHT VERTEBRAL ARTERY:  Patent with antegrade flow. LEFT CAROTID ARTERY:  Mild left carotid bifurcation plaque . LEFT VERTEBRAL ARTERY:  Patent antegrade flow. IMPRESSION: 1. Mild bilateral carotid bifurcation atherosclerotic vascular plaque. No flow limiting stenosis. Degree of stenosis less than 50%. 2. Vertebral arteries are patent with antegrade flow . Electronically Signed   By: Maisie Fus  Register   On: 08/23/2016 11:58   ASSESSMENT AND PLAN:   #1. Left upper extremity weakness due to acute CVA (per MRI brain),  continue aspirin and Plavix, no stenosis per carotid  ultrasound, pending echocardiogram,  F/u PT, OT.  #2. Relative hypotension, holding blood pressure medications  #3. Hyperlipidemia, change Zocor to Lipitor, LDL 112.  #4. Hypokalemia, improved with supplement.  #5. Elevated troponin, unremarkable EKG,  Follow-up echocardiogram and cardiology consult, Continue aspirin, Plavix, Lipitor and low dose of metoprolol  #6. Leukocytosis, likely stress related, improved.   #7. Hyperglycemia, follow-up hemoglobin A1c  I discussed with Dr. Thad Ranger. All the records are reviewed and case discussed with Care Management/Social Worker. Management plans discussed with the patient, husband and they are in agreement.  CODE STATUS: Full code  TOTAL TIME TAKING CARE OF THIS PATIENT: 33 minutes.   More than 50% of the time was spent in counseling/coordination of care: YES  POSSIBLE D/C IN 1-2 DAYS, DEPENDING ON CLINICAL CONDITION.   Shaune Pollack M.D on 08/23/2016 at 6:34 PM  Between 7am to 6pm - Pager - [phone]  After 6pm go to [email or web address] - Social research officer, government  [facility] [place] [facility]  Office  [phone]  CC: Primary care physician; Doristine Bosworth, MD  Note: This dictation was prepared with Dragon dictation along with smaller phrase technology. Any transcriptional errors that result from this process are unintentional.

## 2016-08-23 NOTE — Progress Notes (Signed)
Subjective: Patient reports being improved today.  No new neurological complaints.    Objective: Current vital signs: BP 118/63   Pulse 72   Temp 98 F (36.7 C) (Oral)   Resp 20   Ht 4\' 11"  (1.499 m)   Wt 75.6 kg (166 lb 9.6 oz)   SpO2 97%   BMI 33.65 kg/m  Vital signs in last 24 hours: Temp:  [98 F (36.7 C)-98.4 F (36.9 C)] 98 F (36.7 C) (01/30 0525) Pulse Rate:  [70-98] 72 (01/30 1150) Resp:  [13-22] 20 (01/29 2116) BP: (102-130)/(55-70) 118/63 (01/30 1150) SpO2:  [95 %-97 %] 97 % (01/30 0525) Weight:  [75.3 kg (166 lb)-75.6 kg (166 lb 9.6 oz)] 75.6 kg (166 lb 9.6 oz) (01/30 0500)  Intake/Output from previous day: 01/29 0701 - 01/30 0700 In: 2463.3 [I.V.:2463.3] Out: -  Intake/Output this shift: Total I/O In: 1136 [I.V.:1136] Out: -  Nutritional status: Diet regular Room service appropriate? Yes; Fluid consistency: Thin  Neurologic Exam: Mental Status: Alert, oriented, thought content appropriate.  Speech fluent without evidence of aphasia.  Able to follow 3 step commands without difficulty. Cranial Nerves: II: Discs flat bilaterally; Visual fields grossly normal, pupils equal, round, reactive to light and accommodation III,IV, VI: ptosis not present, extra-ocular motions intact bilaterally V,VII: smile symmetric, facial light touch sensation normal bilaterally VIII: hearing normal bilaterally IX,X: gag reflex present XI: bilateral shoulder shrug XII: midline tongue extension Motor: Right :  Upper extremity   5/5                                      Left:     Upper extremity   5/5             Lower extremity   5/5                                                  Lower extremity   5/5 Tone and bulk:normal tone throughout; no atrophy noted Sensory: Pinprick and light touch intact throughout, bilaterally Deep Tendon Reflexes: 2+ and symmetric with absent AJ's bilaterally  Lab Results: Basic Metabolic Panel:  Recent Labs Lab 08/22/16 1025 08/23/16 0502   NA 135 141  K 3.2* 4.2  CL 103 111  CO2 23 23  GLUCOSE 136* 99  BUN 15 12  CREATININE 0.87 0.65  CALCIUM 9.1 8.9    Liver Function Tests:  Recent Labs Lab 08/22/16 1025  AST 19  ALT 12*  ALKPHOS 68  BILITOT 1.0  PROT 7.7  ALBUMIN 4.0   No results for input(s): LIPASE, AMYLASE in the last 168 hours. No results for input(s): AMMONIA in the last 168 hours.  CBC:  Recent Labs Lab 08/22/16 1025 08/23/16 0502  WBC 12.6* 9.9  NEUTROABS 9.1*  --   HGB 12.6 12.1  HCT 36.8 36.3  MCV 84.6 85.2  PLT 246 262    Cardiac Enzymes:  Recent Labs Lab 08/22/16 1025 08/22/16 1504 08/22/16 2006 08/22/16 2159  TROPONINI 0.03* 0.30* 0.89* 1.02*    Lipid Panel:  Recent Labs Lab 08/23/16 0502  CHOL 177  TRIG 96  HDL 46  CHOLHDL 3.8  VLDL 19  LDLCALC 161112*    CBG:  Recent Labs Lab 08/23/16 0732  GLUCAP 98  Microbiology: No results found for this or any previous visit.  Coagulation Studies:  Recent Labs  08/22/16 1025  LABPROT 14.0  INR 1.08    Imaging: Ct Head Wo Contrast  Result Date: 08/22/2016 CLINICAL DATA:  Code stroke.  Left arm weakness EXAM: CT HEAD WITHOUT CONTRAST TECHNIQUE: Contiguous axial images were obtained from the base of the skull through the vertex without intravenous contrast. COMPARISON:  06/19/2009 FINDINGS: Brain: No evidence of acute infarction, hemorrhage, hydrocephalus, extra-axial collection or mass lesion/mass effect. Mild white matter disease without detectable progression since prior. Superficial high-density along the ventral lower pons is attributed artifact based on reformats. Vascular: No hyperdense vessel. Atherosclerotic calcification. Tortuous vertebrobasilar system. Skull: Negative Sinuses/Orbits: Negative Other: These results were called by telephone at the time of interpretation on 08/22/2016 at 10:38 am to Dr. Jene Every , who verbally acknowledged these results. ASPECTS [facility] Stroke Program Early CT Score) -  Ganglionic level infarction (caudate, lentiform nuclei, internal capsule, insula, M1-M3 cortex): 7 - Supraganglionic infarction (M4-M6 cortex): 3 Total score (0-10 with 10 being normal): 10 IMPRESSION: No acute finding. ASPECTS is 10. Electronically Signed   By: Marnee Spring M.D.   On: 08/22/2016 10:41   Mr Brain Wo Contrast  Result Date: 08/23/2016 CLINICAL DATA:  Stroke and left lower extremity weakness. Left arm weakness. EXAM: MRI HEAD WITHOUT CONTRAST TECHNIQUE: Multiplanar, multiecho pulse sequences of the brain and surrounding structures were obtained without intravenous contrast. 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 There is acute infarction in the left medial thalamus measuring 1 x 1.5 cm. Small area of subacute infarction suspected in the left parietal subcortical white matter. 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 No evidence of hemorrhage, mass effect or shift. Brain elsewhere shows mild chronic small-vessel ischemic change of the white matter. Vascular: Major vessels at the base of the brain show flow. 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 1 x 1.5 cm acute infarction in the medial left thalamus. Possible subacute white matter infarction in the left parietal lobe. 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 Electronically Signed   By: Paulina Fusi M.D.   On: 08/23/2016 11:41   US Carotid Bilateral  Result Date: 08/23/2016 CLINICAL DATA:  CVA. EXAM: BILATERAL CAROTID DUPLEX ULTRASOUND TECHNIQUE: Wallace Cullens scale imaging,  color Doppler and duplex ultrasound were performed of bilateral carotid and vertebral arteries in the neck. COMPARISON:  CT 08/22/2016. FINDINGS: Criteria: Quantification of carotid stenosis is based on velocity parameters that correlate the residual internal carotid diameter with NASCET-based stenosis levels, using the diameter of the distal internal carotid lumen as the denominator for stenosis measurement. The following velocity measurements were obtained: RIGHT ICA:  84/34 cm/sec CCA:  137/24 cm/sec SYSTOLIC ICA/CCA RATIO:  0.6 DIASTOLIC ICA/CCA RATIO:  1.4 ECA:  145 cm/sec LEFT ICA:  126/31 cm/sec CCA:  113/20 cm/sec SYSTOLIC ICA/CCA RATIO:  1.1 DIASTOLIC ICA/CCA RATIO:  1.5 ECA:  129 cm/sec RIGHT CAROTID ARTERY: Mild right carotid bifurcation plaque. RIGHT VERTEBRAL ARTERY:  Patent with antegrade flow. LEFT CAROTID ARTERY:  Mild left carotid bifurcation plaque . LEFT VERTEBRAL ARTERY:  Patent antegrade flow. IMPRESSION: 1. Mild bilateral carotid bifurcation atherosclerotic  vascular plaque. No flow limiting stenosis. Degree of stenosis less than 50%. 2. Vertebral arteries are patent with antegrade flow . Electronically Signed   By: Maisie Fus  Register   On: 08/23/2016 11:58    Medications:  I have reviewed the patient's current medications. Scheduled: . aspirin EC  325 mg Oral Daily  . atorvastatin  40 mg Oral q1800  . cholecalciferol  2,000 Units Oral Daily  . clopidogrel  75 mg Oral Daily  . docusate sodium  100 mg Oral BID  . enoxaparin (LOVENOX) injection  40 mg Subcutaneous Q24H  . metoprolol tartrate  12.5 mg Oral BID  . sodium chloride flush  3 mL Intravenous Q12H    Assessment/Plan: Patient without new neurological complaints.  On ASA and Plavix.  Carotid dopplers show no evidence of hemodynamically significant stenosis.  Echocardiogram pending.  A1c pending, LDL 112.  Recommendations: 1.  Statin for lipid management with target LDL<70. 2.  Continue ASA and Plavix. 3.  MRI, echo  pending   LOS: 1 day   Thana Farr, MD Neurology [phone] 08/23/2016  12:15 PM

## 2016-08-23 NOTE — Progress Notes (Signed)
*  PRELIMINARY RESULTS* Echocardiogram 2D Echocardiogram has been performed.  Sophia Taylor, Sophia Taylor 08/23/2016, 2:31 PM

## 2016-08-23 NOTE — Plan of Care (Signed)
Problem: Tissue Perfusion: Goal: Cerebral tissue perfusion will improve (applicable to all stroke diagnoses) Outcome: Progressing Pt has shown neuro deficits during my care.

## 2016-08-23 NOTE — Progress Notes (Signed)
PT Cancellation Note  Patient Details Name: Sophia Taylor MRN: [id number] DOB: 1955/09/01   Cancelled Treatment:    Reason Eval/Treat Not Completed: Patient not medically ready Pt's troponins are high and trending upward.  Will hold PT until she is more appropriate.    Malachi ProGalen R Gavan Nordby, DPT 08/23/2016, 1:04 PM

## 2016-08-23 NOTE — Consult Note (Signed)
Reason for Consult:Weakness/SOB/CHF Referring Physician: Dr Elta Guadeloupe Sabra Heck primary   Sophia Taylor is an 61 y.o. female.  HPI: Pt was recently admitted with sob hypotension. Weakness has been worst.The pt states that she may have had a TIA . MRI. She has a hx of HTN states that left arm was numb and weak. sHE STAES IMPROVED SYMPTOMS. No cp no sob.  History reviewed. No pertinent past medical history.  History reviewed. No pertinent surgical history.  History reviewed. No pertinent family history.  Social History:  reports that she has never smoked. She has never used smokeless tobacco. She reports that she does not drink alcohol or use drugs.  Allergies:  Allergies  Allergen Reactions  . Codeine Nausea And Vomiting    Medications: I have reviewed the patient's current medications.  Results for orders placed or performed during the hospital encounter of 08/22/16 (from the past 48 hour(s))  APTT     Status: None   Collection Time: 08/22/16 10:25 AM  Result Value Ref Range   aPTT 31 24 - 36 seconds  Comprehensive metabolic panel     Status: Abnormal   Collection Time: 08/22/16 10:25 AM  Result Value Ref Range   Sodium 135 135 - 145 mmol/L   Potassium 3.2 (L) 3.5 - 5.1 mmol/L   Chloride 103 101 - 111 mmol/L   CO2 23 22 - 32 mmol/L   Glucose, Bld 136 (H) 65 - 99 mg/dL   BUN 15 6 - 20 mg/dL   Creatinine, Ser 0.87 0.44 - 1.00 mg/dL   Calcium 9.1 8.9 - 10.3 mg/dL   Total Protein 7.7 6.5 - 8.1 g/dL   Albumin 4.0 3.5 - 5.0 g/dL   AST 19 15 - 41 U/L   ALT 12 (L) 14 - 54 U/L   Alkaline Phosphatase 68 38 - 126 U/L   Total Bilirubin 1.0 0.3 - 1.2 mg/dL   GFR calc non Af Amer >60 >60 mL/min   GFR calc Af Amer >60 >60 mL/min    Comment: (NOTE) The eGFR has been calculated using the CKD EPI equation. This calculation has not been validated in all clinical situations. eGFR's persistently <60 mL/min signify possible Chronic Kidney Disease.    Anion gap 9 5 - 15  CBC WITH  DIFFERENTIAL     Status: Abnormal   Collection Time: 08/22/16 10:25 AM  Result Value Ref Range   WBC 12.6 (H) 3.6 - 11.0 K/uL   RBC 4.35 3.80 - 5.20 MIL/uL   Hemoglobin 12.6 12.0 - 16.0 g/dL   HCT 36.8 35.0 - 47.0 %   MCV 84.6 80.0 - 100.0 fL   MCH 29.0 26.0 - 34.0 pg   MCHC 34.2 32.0 - 36.0 g/dL   RDW 13.3 11.5 - 14.5 %   Platelets 246 150 - 440 K/uL   Neutrophils Relative % 72 %   Neutro Abs 9.1 (H) 1.4 - 6.5 K/uL   Lymphocytes Relative 17 %   Lymphs Abs 2.2 1.0 - 3.6 K/uL   Monocytes Relative 9 %   Monocytes Absolute 1.1 (H) 0.2 - 0.9 K/uL   Eosinophils Relative 1 %   Eosinophils Absolute 0.1 0 - 0.7 K/uL   Basophils Relative 1 %   Basophils Absolute 0.1 0 - 0.1 K/uL  Troponin I     Status: Abnormal   Collection Time: 08/22/16 10:25 AM  Result Value Ref Range   Troponin I 0.03 (HH) <0.03 ng/mL    Comment: CRITICAL RESULT CALLED TO, READ  BACK BY AND VERIFIED WITH MARY NEEDHAM ON 08/22/16 AT 1112 MNS   Protime-INR     Status: None   Collection Time: 08/22/16 10:25 AM  Result Value Ref Range   Prothrombin Time 14.0 11.4 - 15.2 seconds   INR 1.08   TSH     Status: None   Collection Time: 08/22/16 10:25 AM  Result Value Ref Range   TSH 2.233 0.350 - 4.500 uIU/mL    Comment: Performed by a 3rd Generation assay with a functional sensitivity of <=0.01 uIU/mL.  Urinalysis, Routine w reflex microscopic     Status: Abnormal   Collection Time: 08/22/16 12:04 PM  Result Value Ref Range   Color, Urine YELLOW (A) YELLOW   APPearance CLEAR (A) CLEAR   Specific Gravity, Urine 1.020 1.005 - 1.030   pH 5.0 5.0 - 8.0   Glucose, UA NEGATIVE NEGATIVE mg/dL   Hgb urine dipstick NEGATIVE NEGATIVE   Bilirubin Urine NEGATIVE NEGATIVE   Ketones, ur 20 (A) NEGATIVE mg/dL   Protein, ur NEGATIVE NEGATIVE mg/dL   Nitrite NEGATIVE NEGATIVE   Leukocytes, UA NEGATIVE NEGATIVE  Urine Drug Screen, Qualitative ([facility] only)     Status: None   Collection Time: 08/22/16 12:04 PM  Result Value Ref  Range   Tricyclic, Ur Screen NONE DETECTED NONE DETECTED   Amphetamines, Ur Screen NONE DETECTED NONE DETECTED   MDMA (Ecstasy)Ur Screen NONE DETECTED NONE DETECTED   Cocaine Metabolite,Ur [place] NONE DETECTED NONE DETECTED   Opiate, Ur Screen NONE DETECTED NONE DETECTED   Phencyclidine (PCP) Ur S NONE DETECTED NONE DETECTED   Cannabinoid 50 Ng, Ur [place] NONE DETECTED NONE DETECTED   Barbiturates, Ur Screen NONE DETECTED NONE DETECTED   Benzodiazepine, Ur Scrn NONE DETECTED NONE DETECTED   Methadone Scn, Ur NONE DETECTED NONE DETECTED    Comment: (NOTE) 100  Tricyclics, urine               Cutoff 1000 ng/mL 200  Amphetamines, urine             Cutoff 1000 ng/mL 300  MDMA (Ecstasy), urine           Cutoff 500 ng/mL 400  Cocaine Metabolite, urine       Cutoff 300 ng/mL 500  Opiate, urine                   Cutoff 300 ng/mL 600  Phencyclidine (PCP), urine      Cutoff 25 ng/mL 700  Cannabinoid, urine              Cutoff 50 ng/mL 800  Barbiturates, urine             Cutoff 200 ng/mL 900  Benzodiazepine, urine           Cutoff 200 ng/mL 1000 Methadone, urine                Cutoff 300 ng/mL 1100 1200 The urine drug screen provides only a preliminary, unconfirmed 1300 analytical test result and should not be used for non-medical 1400 purposes. Clinical consideration and professional judgment should 1500 be applied to any positive drug screen result due to possible 1600 interfering substances. A more specific alternate chemical method 1700 must be used in order to obtain a confirmed analytical result.  1800 Gas chromato graphy / mass spectrometry (GC/MS) is the preferred 1900 confirmatory method.   Troponin I     Status: Abnormal   Collection Time: 08/22/16  3:04 PM  Result  Value Ref Range   Troponin I 0.30 (HH) <0.03 ng/mL    Comment: CRITICAL RESULT CALLED TO, READ BACK BY AND VERIFIED WITH LAURA CATES ON 08/22/16 AT 1542 [facility]   Troponin I     Status: Abnormal   Collection Time: 08/22/16   8:06 PM  Result Value Ref Range   Troponin I 0.89 (HH) <0.03 ng/mL    Comment: CRITICAL VALUE NOTED. VALUE IS CONSISTENT WITH PREVIOUSLY REPORTED/CALLED VALUE [place]  Troponin I     Status: Abnormal   Collection Time: 08/22/16  9:59 PM  Result Value Ref Range   Troponin I 1.02 (HH) <0.03 ng/mL    Comment: CRITICAL VALUE NOTED. VALUE IS CONSISTENT WITH PREVIOUSLY REPORTED/CALLED VALUE [facility]  Basic metabolic panel     Status: None   Collection Time: 08/23/16  5:02 AM  Result Value Ref Range   Sodium 141 135 - 145 mmol/L   Potassium 4.2 3.5 - 5.1 mmol/L   Chloride 111 101 - 111 mmol/L   CO2 23 22 - 32 mmol/L   Glucose, Bld 99 65 - 99 mg/dL   BUN 12 6 - 20 mg/dL   Creatinine, Ser 0.65 0.44 - 1.00 mg/dL   Calcium 8.9 8.9 - 10.3 mg/dL   GFR calc non Af Amer >60 >60 mL/min   GFR calc Af Amer >60 >60 mL/min    Comment: (NOTE) The eGFR has been calculated using the CKD EPI equation. This calculation has not been validated in all clinical situations. eGFR's persistently <60 mL/min signify possible Chronic Kidney Disease.    Anion gap 7 5 - 15  CBC     Status: None   Collection Time: 08/23/16  5:02 AM  Result Value Ref Range   WBC 9.9 3.6 - 11.0 K/uL   RBC 4.26 3.80 - 5.20 MIL/uL   Hemoglobin 12.1 12.0 - 16.0 g/dL   HCT 36.3 35.0 - 47.0 %   MCV 85.2 80.0 - 100.0 fL   MCH 28.3 26.0 - 34.0 pg   MCHC 33.2 32.0 - 36.0 g/dL   RDW 13.4 11.5 - 14.5 %   Platelets 262 150 - 440 K/uL  Lipid panel     Status: Abnormal   Collection Time: 08/23/16  5:02 AM  Result Value Ref Range   Cholesterol 177 0 - 200 mg/dL   Triglycerides 96 <150 mg/dL   HDL 46 >40 mg/dL   Total CHOL/HDL Ratio 3.8 RATIO   VLDL 19 0 - 40 mg/dL   LDL Cholesterol 112 (H) 0 - 99 mg/dL    Comment:        Total Cholesterol/HDL:CHD Risk Coronary Heart Disease Risk Table                     Men   Women  1/2 Average Risk   3.4   3.3  Average Risk       5.0   4.4  2 X Average Risk   9.6   7.1  3 X Average Risk  23.4   11.0         Use the calculated Patient Ratio above and the CHD Risk Table to determine the patient's CHD Risk.        ATP III CLASSIFICATION (LDL):  <100     mg/dL   Optimal  100-129  mg/dL   Near or Above                    Optimal  130-159  mg/dL   Borderline  160-189  mg/dL   High  >190     mg/dL   Very High   Glucose, capillary     Status: None   Collection Time: 08/23/16  7:32 AM  Result Value Ref Range   Glucose-Capillary 98 65 - 99 mg/dL    Ct Head Wo Contrast  Result Date: 08/22/2016 CLINICAL DATA:  Code stroke.  Left arm weakness EXAM: CT HEAD WITHOUT CONTRAST TECHNIQUE: Contiguous axial images were obtained from the base of the skull through the vertex without intravenous contrast. COMPARISON:  06/19/2009 FINDINGS: Brain: No evidence of acute infarction, hemorrhage, hydrocephalus, extra-axial collection or mass lesion/mass effect. Mild white matter disease without detectable progression since prior. Superficial high-density along the ventral lower pons is attributed artifact based on reformats. Vascular: No hyperdense vessel. Atherosclerotic calcification. Tortuous vertebrobasilar system. Skull: Negative Sinuses/Orbits: Negative Other: These results were called by telephone at the time of interpretation on 08/22/2016 at 10:38 am to Dr. Lavonia Drafts , who verbally acknowledged these results. ASPECTS [facility] Stroke Program Early CT Score) - Ganglionic level infarction (caudate, lentiform nuclei, internal capsule, insula, M1-M3 cortex): 7 - Supraganglionic infarction (M4-M6 cortex): 3 Total score (0-10 with 10 being normal): 10 IMPRESSION: No acute finding. ASPECTS is 10. Electronically Signed   By: Monte Fantasia M.D.   On: 08/22/2016 10:41   Mr Brain Wo Contrast  Result Date: 08/23/2016 CLINICAL DATA:  Stroke and left lower extremity weakness. Left arm weakness. EXAM: MRI HEAD WITHOUT CONTRAST TECHNIQUE: Multiplanar, multiecho pulse sequences of the brain and surrounding structures were  obtained without intravenous contrast. 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 There is acute infarction in the left medial thalamus measuring 1 x 1.5 cm. Small area of subacute infarction suspected in the left parietal subcortical white matter. 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 No evidence of hemorrhage, mass effect or shift. Brain elsewhere shows mild chronic small-vessel ischemic change of the white matter. Vascular: Major vessels at the base of the brain show flow. 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 1 x 1.5 cm acute infarction in the medial left thalamus. Possible subacute white matter infarction in the left parietal lobe. 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 Electronically Signed   By: Nelson Chimes M.D.   On: 08/23/2016 11:41   US Carotid Bilateral  Result Date: 08/23/2016 CLINICAL DATA:  CVA. EXAM: BILATERAL CAROTID DUPLEX ULTRASOUND TECHNIQUE: Pearline Cables scale imaging, color Doppler and duplex ultrasound were performed of bilateral carotid and vertebral arteries in the neck. COMPARISON:  CT 08/22/2016. FINDINGS: Criteria: Quantification of carotid stenosis is based on velocity parameters that correlate the residual internal carotid diameter with NASCET-based stenosis levels, using the diameter of the distal internal carotid lumen as the denominator for stenosis measurement. The following velocity measurements were obtained: RIGHT ICA:  84/34 cm/sec CCA:  062/37 cm/sec SYSTOLIC ICA/CCA RATIO:  0.6 DIASTOLIC ICA/CCA RATIO:   1.4 ECA:  145 cm/sec LEFT ICA:  126/31 cm/sec CCA:  628/31 cm/sec SYSTOLIC ICA/CCA RATIO:  1.1 DIASTOLIC ICA/CCA RATIO:  1.5 ECA:  129 cm/sec RIGHT CAROTID ARTERY: Mild right carotid bifurcation plaque. RIGHT VERTEBRAL ARTERY:  Patent  with antegrade flow. LEFT CAROTID ARTERY:  Mild left carotid bifurcation plaque . LEFT VERTEBRAL ARTERY:  Patent antegrade flow. IMPRESSION: 1. Mild bilateral carotid bifurcation atherosclerotic vascular plaque. No flow limiting stenosis. Degree of stenosis less than 50%. 2. Vertebral arteries are patent with antegrade flow . Electronically Signed   By: Marcello Moores  Register   On: 08/23/2016 11:58    Review of Systems  Constitutional: Positive for diaphoresis and malaise/fatigue.  HENT: Positive for congestion.   Eyes: Negative.   Respiratory: Positive for shortness of breath.   Cardiovascular: Positive for palpitations, leg swelling and PND.  Gastrointestinal: Positive for abdominal pain and heartburn.  Genitourinary: Negative.   Skin: Negative.   Neurological: Positive for weakness.   Blood pressure 140/69, pulse 86, temperature 99 F (37.2 C), temperature source Oral, resp. rate 20, height 4' 11" (1.499 m), weight 75.6 kg (166 lb 9.6 oz), SpO2 98 %. Physical Exam  Nursing note and vitals reviewed. Constitutional: She is oriented to person, place, and time. She appears well-developed and well-nourished.  HENT:  Head: Normocephalic and atraumatic.  Eyes: Conjunctivae and EOM are normal. Pupils are equal, round, and reactive to light.  Neck: Normal range of motion. Neck supple.  Cardiovascular: Normal rate, normal heart sounds and normal pulses.  An irregularly irregular rhythm present.  Respiratory: Effort normal and breath sounds normal.  GI: Soft. Bowel sounds are normal.  Musculoskeletal: Normal range of motion.  Neurological: She is alert and oriented to person, place, and time. She has normal reflexes.  Skin: Skin is warm and dry.  Psychiatric: She has a  normal mood and affect.    Assessment/Plan:  TIA Weakness Hypertension Hyperlipidemia Hypokalemia     PLAN Agree with admission Continue evaluation with with Neurologist Continue Bp control Agree with lipitor for lipid therapy Agree with ASA Start Plavix therapy Correct electrolytes Agree with MRI Consider  holter for assessment for arrthymia I do not rec high grade anticoug Consider Carotid dopplers May need ECHO for valvular disease  Alinda Egolf D Domnic Vantol 08/23/2016, 8:49 PM

## 2016-08-23 NOTE — Progress Notes (Signed)
OT Cancellation Note  Patient Details Name: Sophia Taylor MRN: [id number] DOB: 09-28-1955   Cancelled Treatment:    Reason Eval/Treat Not Completed: Medical issues which prohibited therapy. Holding OT evaluation due to troponin levels trending upwards. Will continue to monitor and re-evaluate as medically appropriate.  Eliezer BottomJamie L Stiller, OTR/L 08/23/2016, 1:42 PM

## 2016-08-24 LAB — ECHOCARDIOGRAM COMPLETE
HEIGHTINCHES: 59 in
WEIGHTICAEL: 2665.6 [oz_av]

## 2016-08-24 LAB — HEMOGLOBIN A1C
Hgb A1c MFr Bld: 5.8 % — ABNORMAL HIGH (ref 4.8–5.6)
Mean Plasma Glucose: 120 mg/dL

## 2016-08-24 LAB — GLUCOSE, CAPILLARY: Glucose-Capillary: 89 mg/dL (ref 65–99)

## 2016-08-24 NOTE — Evaluation (Signed)
Physical Therapy Evaluation Patient Details Name: [name] MRN: [id number] DOB: [birth year] Today's Date: 08/24/2016   History of Present Illness  Sophia Taylor  is a 61 y.o. female with a known history of Stroke with left lower extremity weakness and just on 10, which completely resolved, on aspirin therapy, hypertension, hyperlipidemia, who presents to the hospital with complaints of left upper extremity weakness and numbness. I went to the patient she was doing well early in the morning. She was about to go to the shower, however, while walking into the shower stall. She noted that her left upper extremity is not moving well. He felt weak and numb. Patient denied any visual symptoms. No dysphagia, no facial or left lower extremity numbness or weakness. She presented to the hospital for further evaluation and treatment, she continues to have some left upper extremity weakness. Hospitalist services were contacted for admission for stroke. Patient was seen by neurologist, who recommended to continue aspirin and Plavix therapy, get MRI, MRA of the brain and other studies. CT scan of the head in the emergency room was unremarkable. Brain MRI ruled pt in fro actue R parietal MCA territory infarct and possible L parietal infarct. Pt reports complete resolution of symptoms at the time of PT evaluation.  Clinical Impression  Pt is independent with all mobility at time of PT evaluation and reports no deficits. No deficits identified during examination. Pt provided education regarding what to do if symptoms recur as well as her increased risk for future events. Encouraged pt to follow recommendations from MD regarding medication/lifestyle changes. Encouraged regular exercise as cleared by her PCP. No further PT needs identified. Order will be completed. Please enter new order if status or needs change.     Follow Up Recommendations No PT follow up    Equipment Recommendations  None recommended by PT     Recommendations for Other Services       Precautions / Restrictions Precautions Precautions: None Restrictions Weight Bearing Restrictions: No      Mobility  Bed Mobility               General bed mobility comments: Received and left upright in recliner  Transfers Overall transfer level: Independent               General transfer comment: Good speed/sequencing noted without instability  Ambulation/Gait Ambulation/Gait assistance: Independent Ambulation Distance (Feet): 120 Feet Assistive device: None Gait Pattern/deviations: WFL(Within Functional Limits) Gait velocity: WFL for full community mobility Gait velocity interpretation: >2.62 ft/sec, indicative of independent community ambulator General Gait Details: Pt ambulates independently. No abnormalities noted. Able to perform horizontal and vertical head turns without LOB or gait deviation. Gait speed changes and quick turns normal. No foot drop. No instability noted. 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[place] (Stroke Patients Only)       Balance Overall balance assessment: Independent             Standing balance comment: Negative [place], single leg balance >10s on each LE                             Pertinent Vitals/Pain Pain Assessment: No/denies pain    Home Living Family/patient expects to be discharged to:: Private residence Living Arrangements: Spouse/significant other;Children;Other (Comment) (son) Available Help at Discharge: Family Type of Home: Other(Comment) (Condo) Home Access: Stairs to  enter Entrance Stairs-Rails: None Entrance Stairs-Number of Steps: 3 Home Layout: Two level;Bed/bath upstairs Home Equipment: Grab bars - tub/shower (No assistive device)      Prior Function Level of Independence: Independent         Comments: Independent with ADLs/IADLs. Drives and works. No falls     Hand Dominance   Dominant Hand:  Right    Extremity/Trunk Assessment   Upper Extremity Assessment Upper Extremity Assessment: Overall WFL for tasks assessed;RUE deficits/detail RUE Deficits / Details: no focal weakness or sensory loss UE/LE bilaterally. Finger to nose and rapid alternating movement intact. Facial symmetry/strength WNL. Ocular ROM WNL without nystagmus. Denies dizziness. Tongue midline. Finger opposition normal. Mytomes C4-T1 and L2-S2 WNL. No abnormal tone present. Pronator drift negative.    Lower Extremity Assessment Lower Extremity Assessment: Overall WFL for tasks assessed       Communication   Communication: No difficulties  Cognition Arousal/Alertness: Awake/alert Behavior During Therapy: WFL for tasks assessed/performed Overall Cognitive Status: Within Functional Limits for tasks assessed                      General Comments      Exercises     Assessment/Plan    PT Assessment Patent does not need any further PT services  PT Problem List            PT Treatment Interventions      PT Goals (Current goals can be found in the Care Plan section)  Acute Rehab PT Goals PT Goal Formulation: All assessment and education complete, DC therapy    Frequency     Barriers to discharge        Co-evaluation               End of Session   Activity Tolerance: Patient tolerated treatment well Patient left: in chair;with call bell/phone within reach;with nursing/sitter in room;with family/visitor present Nurse Communication: Mobility status (RN present to observe)    Functional Assessment Tool Used: clinical judgement Functional Limitation: Mobility: Walking and moving around Mobility: Walking and Moving Around Current Status [phone](G8978): 0 percent impaired, limited or restricted Mobility: Walking and Moving Around Goal Status [phone](G8979): 0 percent impaired, limited or restricted Mobility: Walking and Moving Around Discharge Status [phone](G8980): 0 percent impaired, limited or  restricted    Time: 0926-0943 PT Time Calculation (min) (ACUTE ONLY): 17 min   Charges:   PT Evaluation $PT Eval Low Complexity: 1 Procedure     PT G Codes:   PT G-Codes **NOT FOR INPATIENT CLASS** Functional Assessment Tool Used: clinical judgement Functional Limitation: Mobility: Walking and moving around Mobility: Walking and Moving Around Current Status ([id number](G8978): 0 percent impaired, limited or restricted Mobility: Walking and Moving Around Goal Status ([id number](G8979): 0 percent impaired, limited or restricted Mobility: Walking and Moving Around Discharge Status ([id number](G8980): 0 percent impaired, limited or restricted   Sharalyn InkJason D Baila Rouse PT, DPT   Zavannah Deblois 08/24/2016, 11:02 AM

## 2016-08-24 NOTE — Progress Notes (Signed)
Subjective: Patient has had no further neurological events.    Objective: Current vital signs: BP 129/63 (BP Location: Left Arm)   Pulse 74   Temp 98.1 F (36.7 C)   Resp 20   Ht 4\' 11"  (1.499 m)   Wt 75.3 kg (166 lb)   SpO2 99%   BMI 33.53 kg/m  Vital signs in last 24 hours: Temp:  [98.1 F (36.7 C)-99 F (37.2 C)] 98.1 F (36.7 C) (01/31 0450) Pulse Rate:  [74-86] 74 (01/31 0450) Resp:  [20] 20 (01/31 0450) BP: (129-140)/(63-69) 129/63 (01/31 0450) SpO2:  [98 %-99 %] 99 % (01/31 0450) Weight:  [75.3 kg (166 lb)] 75.3 kg (166 lb) (01/31 0500)  Intake/Output from previous day: 01/30 0701 - 01/31 0700 In: 1256 [P.O.:120; I.V.:1136] Out: -  Intake/Output this shift: Total I/O In: 240 [P.O.:240] Out: -  Nutritional status: Diet - low sodium heart healthy Diet Heart Room service appropriate? Yes; Fluid consistency: Thin Diet NPO time specified  Neurologic Exam: Mental Status: Alert, oriented, thought content appropriate. Speech fluent without evidence of aphasia. Able to follow 3 step commands without difficulty. Cranial Nerves: II: Discs flat bilaterally; Visual fields grossly normal, pupils equal, round, reactive to light and accommodation III,IV, VI: ptosis not present, extra-ocular motions intact bilaterally V,VII: smile symmetric, facial light touch sensation normal bilaterally VIII: hearing normal bilaterally IX,X: gag reflex present XI: bilateral shoulder shrug XII: midline tongue extension Motor: Right :Upper extremity 5/5Left: Upper extremity 5/5 Lower extremity 5/5Lower extremity 5/5   Lab Results: Basic Metabolic Panel:  Recent Labs Lab 08/22/16 1025 08/23/16 0502  NA 135 141  K 3.2* 4.2  CL 103 111  CO2 23 23  GLUCOSE 136* 99  BUN 15 12  CREATININE 0.87 0.65  CALCIUM 9.1 8.9    Liver Function Tests:  Recent Labs Lab  08/22/16 1025  AST 19  ALT 12*  ALKPHOS 68  BILITOT 1.0  PROT 7.7  ALBUMIN 4.0   No results for input(s): LIPASE, AMYLASE in the last 168 hours. No results for input(s): AMMONIA in the last 168 hours.  CBC:  Recent Labs Lab 08/22/16 1025 08/23/16 0502  WBC 12.6* 9.9  NEUTROABS 9.1*  --   HGB 12.6 12.1  HCT 36.8 36.3  MCV 84.6 85.2  PLT 246 262    Cardiac Enzymes:  Recent Labs Lab 08/22/16 1025 08/22/16 1504 08/22/16 2006 08/22/16 2159  TROPONINI 0.03* 0.30* 0.89* 1.02*    Lipid Panel:  Recent Labs Lab 08/23/16 0502  CHOL 177  TRIG 96  HDL 46  CHOLHDL 3.8  VLDL 19  LDLCALC 096*    CBG:  Recent Labs Lab 08/23/16 0732 08/24/16 0740  GLUCAP 98 89    Microbiology: No results found for this or any previous visit.  Coagulation Studies:  Recent Labs  08/22/16 1025  LABPROT 14.0  INR 1.08    Imaging: Mr Brain Wo Contrast  Result Date: 08/23/2016 CLINICAL DATA:  Stroke and left lower extremity weakness. Left arm weakness. EXAM: MRI HEAD WITHOUT CONTRAST TECHNIQUE: Multiplanar, multiecho pulse sequences of the brain and surrounding structures were obtained without intravenous contrast. 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 There is acute infarction in the left medial thalamus measuring 1 x 1.5 cm. Small area of subacute infarction suspected in the left parietal subcortical white matter. This distribution of insults suggests embolic disease from  the heart or ascending aorta. No evidence of hemorrhage, mass effect or shift. Brain elsewhere shows mild chronic small-vessel ischemic change of the white matter. Vascular: Major vessels at the base of the brain show flow. 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 1 x 1.5 cm acute infarction in the medial left thalamus. Possible subacute white matter infarction in the left parietal lobe. 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 Electronically Signed   By: Paulina FusiMark  Shogry M.D.   On: 08/23/2016 11:41   [place] Carotid Bilateral  Result Date: 08/23/2016 CLINICAL DATA:  CVA. EXAM: BILATERAL CAROTID DUPLEX ULTRASOUND TECHNIQUE: Wallace CullensGray scale imaging, color Doppler and duplex ultrasound were performed of bilateral carotid and vertebral arteries in the neck. COMPARISON:  CT 08/22/2016. FINDINGS: Criteria: Quantification of carotid stenosis is based on velocity parameters that correlate the residual internal carotid diameter with NASCET-based stenosis levels, using the diameter of the distal internal carotid lumen as the denominator for stenosis measurement. The following velocity measurements were obtained: RIGHT ICA:  84/34 cm/sec CCA:  137/24 cm/sec SYSTOLIC ICA/CCA RATIO:  0.6 DIASTOLIC ICA/CCA RATIO:  1.4 ECA:  145 cm/sec LEFT ICA:  126/31 cm/sec CCA:  113/20 cm/sec SYSTOLIC ICA/CCA RATIO:  1.1 DIASTOLIC ICA/CCA RATIO:  1.5 ECA:  129 cm/sec RIGHT CAROTID ARTERY: Mild right carotid bifurcation plaque. RIGHT VERTEBRAL ARTERY:  Patent with antegrade flow. LEFT CAROTID ARTERY:  Mild left carotid bifurcation plaque . LEFT VERTEBRAL ARTERY:  Patent antegrade flow. IMPRESSION: 1. Mild bilateral carotid bifurcation atherosclerotic vascular plaque. No flow limiting stenosis. Degree of stenosis less than 50%. 2. Vertebral arteries are patent with antegrade flow . Electronically Signed   By: Maisie Fushomas  Register   On: 08/23/2016 11:58    Medications:  I have reviewed the patient's current medications. Scheduled: . aspirin EC  325 mg Oral Daily  . atorvastatin  40 mg Oral q1800  . cholecalciferol  2,000 Units Oral  Daily  . clopidogrel  75 mg Oral Daily  . docusate sodium  100 mg Oral BID  . enoxaparin (LOVENOX) injection  40 mg Subcutaneous Q24H  . metoprolol tartrate  12.5 mg Oral BID  . sodium chloride flush  3 mL Intravenous Q12H    Assessment/Plan: Patient stable on ASA and Plavix.  MRI of the brain reviewed and shows small acute infarcts in the right MCA distribution, left thalamus and left parietal lobe.  Concern is for embolic etiology.  Echocardiogram results pending.    Recommendations: 1.  Would continue ASA and Plavix 2.  If TTE unremarkable would consider TEE. 3.  If all cardiac work up unremarkable would continue ASA and Plavix and have patient follow with with cardiology on an outpatient basis for LINQ/holter placement.    Case discussed with Dr. Imogene Burnhen   LOS: 2 days   Thana FarrLeslie Sianne Tejada, MD Neurology [phone] 08/24/2016  2:12 PM

## 2016-08-24 NOTE — Evaluation (Signed)
   Occupational Therapy Evaluation Patient Details Name: Sophia Taylor MRN: [id number] DOB: 03/11/1956 Today's Date: 08/24/2016    History of Present Illness Pt. is a 61 y.o. female who was admitted to [facility] with a CVA with left sided numbness, and weakness. MRI imaging revealed an acute right parietal MCA territory infarct, and possible left parietel infarct. pt. PMHx includes: HTN, and Hyperlipidemia   Clinical Impression   Pt. is a 61 y.o. female who [facility] with a CVA and left sided weakness. Pt. initial UE symptoms have resolved. Pt. is using bilateral UEs to complete ADL and IADL functioning.  No further OT services are warranted at this time.     Follow Up Recommendations  No OT follow up    Equipment Recommendations       Recommendations for Other Services       Precautions / Restrictions Precautions Precautions: None Restrictions Weight Bearing Restrictions: No              ADL Overall ADL's : Independent with set-up.                                             Vision     Perception     Praxis      Pertinent Vitals/Pain Pain Assessment: No/denies pain     Hand Dominance Right   Extremity/Trunk Assessment   Communication Communication Communication: No difficulties   Cognition Arousal/Alertness: Awake/alert Behavior During Therapy: WFL for tasks assessed/performed Overall Cognitive Status: Within Functional Limits for tasks assessed                     General Comments       Exercises       Shoulder Instructions      Home Living Family/patient expects to be discharged to:: Private residence Living Arrangements: Spouse/significant other;Children Available Help at Discharge: Family Type of Home: House Home Access: Stairs to enter Secretary/administratorntrance Stairs-Number of Steps: 3 Entrance Stairs-Rails: Right Home Layout: Two level Alternate Level Stairs-Number of Steps: 14 Alternate Level Stairs-Rails:  Right Bathroom Shower/Tub: Tub/shower unit Shower/tub characteristics: Engineer, building servicesCurtain Bathroom Toilet: Standard     Home Equipment: Grab bars - tub/shower          Prior Functioning/Environment Level of Independence: Independent        Comments: Independent with ADLs/IADLs. 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        OT Problem List:     OT Treatment/Interventions:      OT Goals(Current goals can be found in the care plan section) Acute Rehab OT Goals Patient Stated Goal: To return home, get back to normal OT Goal Formulation: With patient Potential to Achieve Goals: Good  OT Frequency:     Barriers to D/C:            Co-evaluation              End of Session    Activity Tolerance: Patient tolerated treatment well Patient left: in chair;with call bell/phone within reach   Time: [id number] OT Time Calculation (min): 15 min Charges:  OT General Charges $OT Visit: 1 Procedure OT Evaluation $OT Eval Moderate Complexity: 1 Procedure G-Codes:    Olegario MessierElaine Ibrahima Holberg, MS, OTR/L 08/24/2016, 11:43 AM

## 2016-08-24 NOTE — Progress Notes (Signed)
[facility] - [place] at [facility]   PATIENT NAME: Sophia Taylor    MR#:  [id number]  DATE OF BIRTH:  [birth year]  SUBJECTIVE:  CHIEF COMPLAINT:   Chief Complaint  Patient presents with  . Extremity Weakness  . Code Stroke   No complaint. REVIEW OF SYSTEMS:  Review of Systems  Constitutional: Negative for chills and fever.  HENT: Negative for congestion and hearing loss.   Eyes: Negative for blurred vision, double vision and photophobia.  Respiratory: Negative for cough and shortness of breath.   Cardiovascular: Negative for chest pain and leg swelling.  Gastrointestinal: Negative for abdominal pain, blood in stool, diarrhea, melena, nausea and vomiting.  Genitourinary: Negative for dysuria and hematuria.  Musculoskeletal: Negative for back pain.  Skin: Negative for itching and rash.  Neurological: Negative for dizziness, tingling, sensory change, focal weakness, loss of consciousness, weakness and headaches.  Psychiatric/Behavioral: Negative for depression. The patient is not nervous/anxious.     DRUG ALLERGIES:   Allergies  Allergen Reactions  . Codeine Nausea And Vomiting   VITALS:  Blood pressure (!) 99/54, pulse 79, temperature 98.6 F (37 C), temperature source Oral, resp. rate 18, height 4\' 11"  (1.499 m), weight 166 lb (75.3 kg), SpO2 98 %. PHYSICAL EXAMINATION:  Physical Exam  Constitutional: She is oriented to person, place, and time and well-developed, well-nourished, and in no distress.  HENT:  Head: Normocephalic.  Mouth/Throat: Oropharynx is clear and moist.  Eyes: Conjunctivae and EOM are normal. Pupils are equal, round, and reactive to light.  Neck: Normal range of motion. Neck supple. No JVD present. No tracheal deviation present.  Cardiovascular: Normal rate, regular rhythm and normal heart sounds.  Exam reveals no gallop.   No murmur heard. Pulmonary/Chest: Effort normal and breath sounds normal. No respiratory distress. She has  no wheezes.  Abdominal: Soft. Bowel sounds are normal. She exhibits no distension. There is no tenderness.  Musculoskeletal: Normal range of motion. She exhibits no edema or tenderness.  Neurological: She is alert and oriented to person, place, and time. No cranial nerve deficit.  Skin: No rash noted. No erythema.  Psychiatric: Affect and judgment normal.   LABORATORY PANEL:   CBC  Recent Labs Lab 08/23/16 0502  WBC 9.9  HGB 12.1  HCT 36.3  PLT 262   ------------------------------------------------------------------------------------------------------------------ Chemistries   Recent Labs Lab 08/22/16 1025 08/23/16 0502  NA 135 141  K 3.2* 4.2  CL 103 111  CO2 23 23  GLUCOSE 136* 99  BUN 15 12  CREATININE 0.87 0.65  CALCIUM 9.1 8.9  AST 19  --   ALT 12*  --   ALKPHOS 68  --   BILITOT 1.0  --    RADIOLOGY:  No results found. ASSESSMENT AND PLAN:   #1. Left upper extremity weakness due to acute CVA (per MRI brain),  continue aspirin and Plavix, no stenosis per carotid ultrasound, pending echocardiogram. Per Dr. Thad Rangereynolds, Would continue ASA and Plavix, if TTE unremarkable would consider TEE. No home PT for PT evaluation.  #2. Relative hypotension, holding blood pressure medications  #3. Hyperlipidemia, changed Zocor to Lipitor, LDL 112.  #4. Hypokalemia, improved with supplement.  #5. Elevated troponin, unremarkable EKG,  Follow-up echocardiogram and cardiology consult, Continue aspirin, Plavix, Lipitor and low dose of metoprolol  #6. Leukocytosis, likely stress related, improved.   #7. Hyperglycemia, follow-up hemoglobin A1c 5.8.  I discussed with Dr. Thad Rangereynolds. All the records are reviewed and case discussed with Care Management/Social Worker. Management plans  discussed with the patient, husband and they are in agreement.  CODE STATUS: Full code  TOTAL TIME TAKING CARE OF THIS PATIENT: 28 minutes.   More than 50% of the time was spent in  counseling/coordination of care: YES  POSSIBLE D/C IN 1-2 DAYS, DEPENDING ON CLINICAL CONDITION.   Shaune Pollack M.D on 08/24/2016 at 4:40 PM  Between 7am to 6pm - Pager - [phone]  After 6pm go to [email or web address] - Social research officer, government  [facility]  Office  [phone]  CC: Primary care physician; Doristine Bosworth, MD  Note: This dictation was prepared with Dragon dictation along with smaller phrase technology. Any transcriptional errors that result from this process are unintentional.

## 2016-08-25 ENCOUNTER — Encounter
Admission: EM | Disposition: A | Discharge status: Home / Self Care | Payer: Self-pay | Source: Home / Self Care | Attending: Internal Medicine

## 2016-08-25 ENCOUNTER — Inpatient Hospital Stay
Admit: 2016-08-25 | Discharge: 2016-08-25 | Disposition: A | Discharge status: Home / Self Care | Payer: BLUE CROSS/BLUE SHIELD | Attending: Internal Medicine | Admitting: Internal Medicine

## 2016-08-25 HISTORY — PX: TEE WITHOUT CARDIOVERSION: SHX5443

## 2016-08-25 LAB — GLUCOSE, CAPILLARY: GLUCOSE-CAPILLARY: 91 mg/dL (ref 65–99)

## 2016-08-25 SURGERY — ECHOCARDIOGRAM, TRANSESOPHAGEAL
Anesthesia: Moderate Sedation

## 2016-08-25 MED ORDER — MIDAZOLAM HCL 2 MG/2ML IJ SOLN
INTRAMUSCULAR | Status: AC | PRN
  Administered 2016-08-25: 1 mg via INTRAVENOUS
  Administered 2016-08-25: 2 mg via INTRAVENOUS
  Administered 2016-08-25 (×3): 1 mg via INTRAVENOUS

## 2016-08-25 MED ORDER — FENTANYL CITRATE (PF) 100 MCG/2ML IJ SOLN
INTRAMUSCULAR | Status: AC | PRN
  Administered 2016-08-25: 25 ug via INTRAVENOUS
  Administered 2016-08-25: 50 ug via INTRAVENOUS
  Administered 2016-08-25 (×3): 25 ug via INTRAVENOUS

## 2016-08-25 MED ORDER — SODIUM CHLORIDE 0.9 % IV SOLN: INTRAVENOUS | Status: DC

## 2016-08-25 MED ORDER — ASPIRIN 325 MG PO TBEC: 325.0000 mg | DELAYED_RELEASE_TABLET | Freq: Every day | ORAL | 2 refills | Status: AC

## 2016-08-25 MED ORDER — LIDOCAINE VISCOUS 2 % MT SOLN
OROMUCOSAL | Status: AC
  Filled 2016-08-25: qty 15

## 2016-08-25 MED ORDER — CLOPIDOGREL BISULFATE 75 MG PO TABS: 75.0000 mg | ORAL_TABLET | Freq: Every day | ORAL | 0 refills | Status: AC

## 2016-08-25 MED ORDER — ATORVASTATIN CALCIUM 40 MG PO TABS: 40.0000 mg | ORAL_TABLET | Freq: Every day | ORAL | 2 refills | Status: DC

## 2016-08-25 MED ORDER — BUTAMBEN-TETRACAINE-BENZOCAINE 2-2-14 % EX AERO
INHALATION_SPRAY | CUTANEOUS | Status: AC
  Filled 2016-08-25: qty 20

## 2016-08-25 MED ORDER — FENTANYL CITRATE (PF) 100 MCG/2ML IJ SOLN
INTRAMUSCULAR | Status: AC
  Filled 2016-08-25: qty 4

## 2016-08-25 MED ORDER — MIDAZOLAM HCL 5 MG/5ML IJ SOLN
INTRAMUSCULAR | Status: AC
  Filled 2016-08-25: qty 10

## 2016-08-25 MED ORDER — SODIUM CHLORIDE FLUSH 0.9 % IV SOLN
INTRAVENOUS | Status: AC
  Filled 2016-08-25: qty 10

## 2016-08-25 MED ORDER — METOPROLOL TARTRATE 25 MG PO TABS: 12.5000 mg | ORAL_TABLET | Freq: Two times a day (BID) | ORAL | 2 refills | Status: DC

## 2016-08-25 NOTE — Progress Notes (Signed)
Patient discharged home per MD order. Prescriptions given to patient. All discharge instructions given and all questions answered. 

## 2016-08-25 NOTE — Discharge Summary (Addendum)
[facility] - 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 No pertinent past medical history. HOSPITAL COURSE:   #1. Left upper extremity weakness due to acute CVA (per MRI brain),  continue aspirin and Plavix, no stenosis per carotid ultrasound, pending echocardiogram. Per Dr. Thad Ranger, continue ASA and Plavix,.  TEE: Probable source of neurologic events from aneurysmal intra atrial septum Per Dr. Lady Gary, Agree with asa and plavix. Will evaluate for occult afib as outpatient.   No home PT for PT evaluation.  #2. Relative hypotension, holding blood pressure medications if BP is low.  #3. Hyperlipidemia, changed Zocor to Lipitor, LDL 112.  #4. Hypokalemia, improved with supplement.  #5. Elevated troponin, unremarkable EKG,  Follow-up echocardiogram and cardiology consult, Continue aspirin, Plavix, Lipitor and low dose of metoprolol  #6. Leukocytosis, likely stress related, improved.   #7. Hyperglycemia, improved. follow-up hemoglobin A1c 5.8.  I discussed with Dr. Thad Ranger and Dr. Lady Gary.  DISCHARGE CONDITIONS:  Stable, discharge to home today. CONSULTS OBTAINED:  Treatment Team:  Kym Groom, MD Alwyn Pea, MD Dalia Heading, MD DRUG ALLERGIES:   Allergies  Allergen Reactions  . Codeine Nausea And Vomiting   DISCHARGE MEDICATIONS:   Allergies as of 08/25/2016      Reactions   Codeine Nausea And Vomiting      Medication List    STOP taking these medications   amLODipine 10  MG tablet Commonly known as:  NORVASC     TAKE these medications   aspirin 325 MG EC tablet Take 1 tablet (325 mg total) by mouth daily. What changed:  medication strength  how much to take   atorvastatin 40 MG tablet Commonly known as:  LIPITOR Take 1 tablet (40 mg total) by mouth daily at 6 PM.   clopidogrel 75 MG tablet Commonly known as:  PLAVIX Take 1 tablet (75 mg total) by mouth daily.   lisinopril 10 MG tablet Commonly known as:  PRINIVIL,ZESTRIL Take 10 mg by mouth daily.   Vitamin D 2000 units Caps Take 2,000 Units by mouth daily.        DISCHARGE INSTRUCTIONS:  See AVS.  If you experience worsening of your admission symptoms, develop shortness of breath, life threatening emergency, suicidal or homicidal thoughts you must seek medical attention immediately by calling 911 or calling your MD immediately  if symptoms less severe.  You Must read complete instructions/literature along with all the possible adverse reactions/side effects for all the Medicines you take and that have been prescribed to you. Take any new Medicines after you have completely understood and accpet all the possible adverse reactions/side effects.   Please note  You were cared for by a hospitalist during your hospital stay. If you have any questions about your discharge medications or the care you received while you were in the hospital after you are discharged, you can call the unit and asked to speak with the hospitalist on call if the hospitalist that took care of you is not available. Once you are discharged, your primary care physician will handle any further  medical issues. Please note that NO REFILLS for any discharge medications will be authorized once you are discharged, as it is imperative that you return to your primary care physician (or establish a relationship with a primary care physician if you do not have one) for your aftercare needs so that they can reassess your need for  medications and monitor your lab values.    On the day of Discharge:  VITAL SIGNS:  Blood pressure 114/61, pulse 74, temperature 98.4 F (36.9 C), temperature source Oral, resp. rate 16, height 4\' 11"  (1.499 m), weight 163 lb (73.9 kg), SpO2 96 %. PHYSICAL EXAMINATION:  GENERAL:  61 y.o.-year-old patient lying in the bed with no acute distress.  EYES: Pupils equal, round, reactive to light and accommodation. No scleral icterus. Extraocular muscles intact.  HEENT: Head atraumatic, normocephalic. Oropharynx and nasopharynx clear.  NECK:  Supple, no jugular venous distention. No thyroid enlargement, no tenderness.  LUNGS: Normal breath sounds bilaterally, no wheezing, rales,rhonchi or crepitation. No use of accessory muscles of respiration.  CARDIOVASCULAR: S1, S2 normal. No murmurs, rubs, or gallops.  ABDOMEN: Soft, non-tender, non-distended. Bowel sounds present. No organomegaly or mass.  EXTREMITIES: No pedal edema, cyanosis, or clubbing.  NEUROLOGIC: Cranial nerves II through XII are intact. Muscle strength 5/5 in all extremities. Sensation intact. Gait not checked.  PSYCHIATRIC: The patient is alert and oriented x 3.  SKIN: No obvious rash, lesion, or ulcer.  DATA REVIEW:   CBC  Recent Labs Lab 08/23/16 0502  WBC 9.9  HGB 12.1  HCT 36.3  PLT 262    Chemistries   Recent Labs Lab 08/22/16 1025 08/23/16 0502  NA 135 141  K 3.2* 4.2  CL 103 111  CO2 23 23  GLUCOSE 136* 99  BUN 15 12  CREATININE 0.87 0.65  CALCIUM 9.1 8.9  AST 19  --   ALT 12*  --   ALKPHOS 68  --   BILITOT 1.0  --      Microbiology Results  No results found for this or any previous visit.  RADIOLOGY:  No results found.   Management plans discussed with the patient, her husband and they are in agreement.  CODE STATUS:     Code Status Orders        Start     Ordered   08/22/16 2215  Full code  Continuous     08/22/16 2214    Code Status History    Date Active Date Inactive Code  Status Order ID Comments User Context   This patient has a current code status but no historical code status.      TOTAL TIME TAKING CARE OF THIS PATIENT: 36 minutes.    Shaune Pollackhen, Lenord Fralix M.D on 08/25/2016 at 2:13 PM  Between 7am to 6pm - Pager - [phone]  After 6pm go to [email or web address] - Social research officer, governmentpassword EPAS [email or web address]  [facility] [place] [facility]  Office  [phone]  CC: Primary care physician; Doristine BosworthMark D Miller, MD   Note: This dictation was prepared with Dragon dictation along with smaller phrase technology. Any transcriptional errors that result from this process are unintentional.

## 2016-08-25 NOTE — Progress Notes (Signed)
.    TRANSESOPHAGEAL ECHOCARDIOGRAM   NAME:  Webb Lawslizabeth C Osorno   MRN: [id number] DOB:  July 03, 1956   ADMIT DATE: 08/22/2016  INDICATIONS:   PROCEDURE:   Informed consent was obtained prior to the procedure. The risks, benefits and alternatives for the procedure were discussed and the patient comprehended these risks.  Risks include, but are not limited to, cough, sore throat, vomiting, nausea, somnolence, esophageal and stomach trauma or perforation, bleeding, low blood pressure, aspiration, pneumonia, infection, trauma to the teeth and death.    After a procedural time-out, the patient was given 6 mg versed and 150 mcg fentanyl for moderate sedation.  The oropharynx was anesthetized 1 cc of topical 1% viscous lidocaine.  The transesophageal probe was inserted in the esophagus and stomach without difficulty and multiple views were obtained.    COMPLICATIONS:    There were no immediate complications.  FINDINGS:  LEFT VENTRICLE: EF = 55%. No regional wall motion abnormalities.  RIGHT VENTRICLE: Normal size and function.   LEFT ATRIUM: Normal size. No thrombus  LEFT ATRIAL APPENDAGE: No thrombus.   RIGHT ATRIUM:normal  AORTIC VALVE:  Trileaflet.   MITRAL VALVE:    Normal.  TRICUSPID VALVE: Normal.  PULMONIC VALVE: Grossly normal.  INTERATRIAL SEPTUM: No PFO or ASD.. 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 No thrombus noted  PERICARDIUM: No effusion  DESCENDING AORTA: Mild calcification  CONCLUSION LV normal No asd Aneurysmal intratrial septum.  Will need dual antiplatelet meds asa 81 mg daily and plavix 75 mg daiily .

## 2016-08-25 NOTE — Progress Notes (Signed)
TEE completed. No immediaate complications.  Probable source of neurologic events from aneurysmal intra atrial septum Agree with asa and plavix. Will evaluate for occult afib as outpatient.

## 2016-08-25 NOTE — Discharge Instructions (Signed)
Heart healthy diet. °Fall precaution. °

## 2016-08-25 NOTE — Progress Notes (Signed)
*  PRELIMINARY RESULTS* Echocardiogram Echocardiogram Transesophageal has been performed.  Cristela BlueHege, Shauntell Iglesia 08/25/2016, 1:21 PM

## 2016-08-26 ENCOUNTER — Encounter: Payer: Self-pay | Admitting: Cardiology

## 2017-05-19 IMAGING — US US CAROTID DUPLEX BILAT
1 series · 13 of 24 positions shown · non-contrast
Comparison: CT 08/22/2016.

CLINICAL DATA: CVA.

EXAM:
BILATERAL CAROTID DUPLEX ULTRASOUND
TECHNIQUE: Gray scale imaging, color Doppler and duplex ultrasound were
performed of bilateral carotid and vertebral arteries in the neck.

[Series 1: us carotid duplex bilat · 13 of 65 slices shown]
[im 1/65]
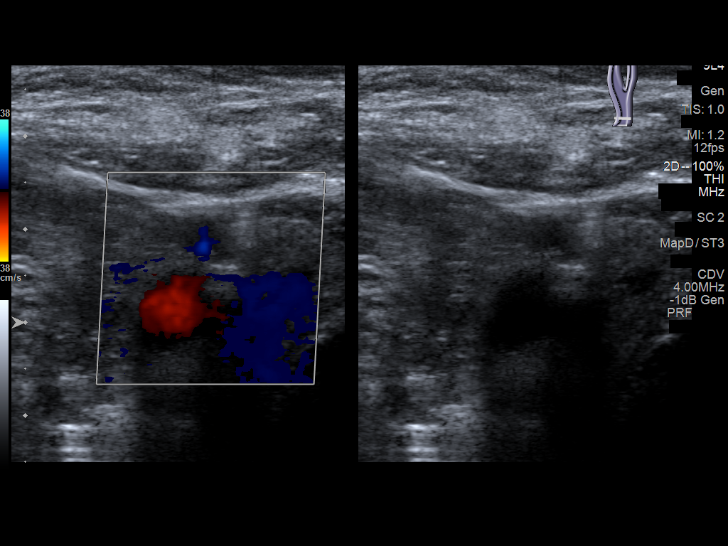
[im 6/65]
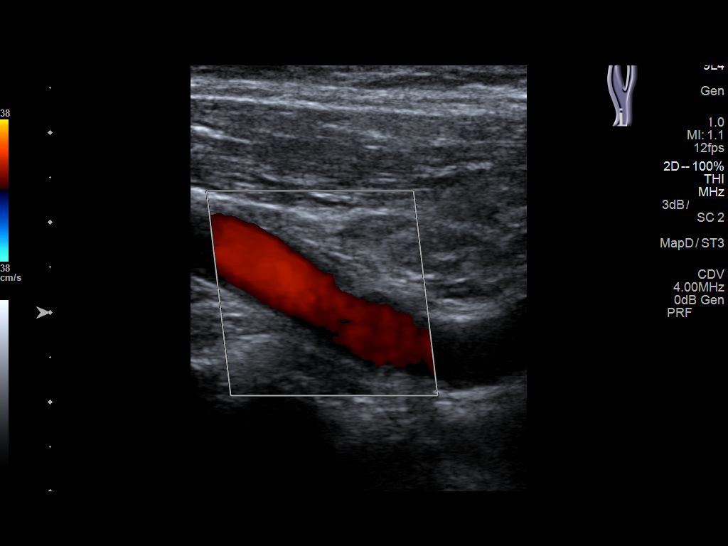
[im 12/65]
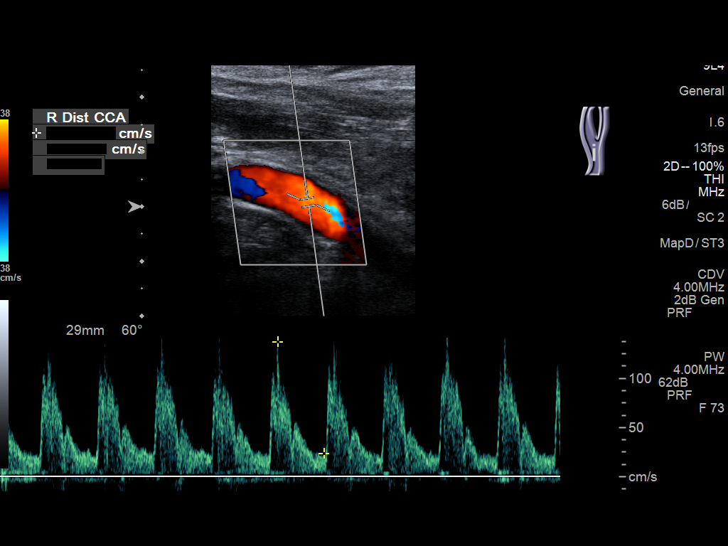
[im 17/65]
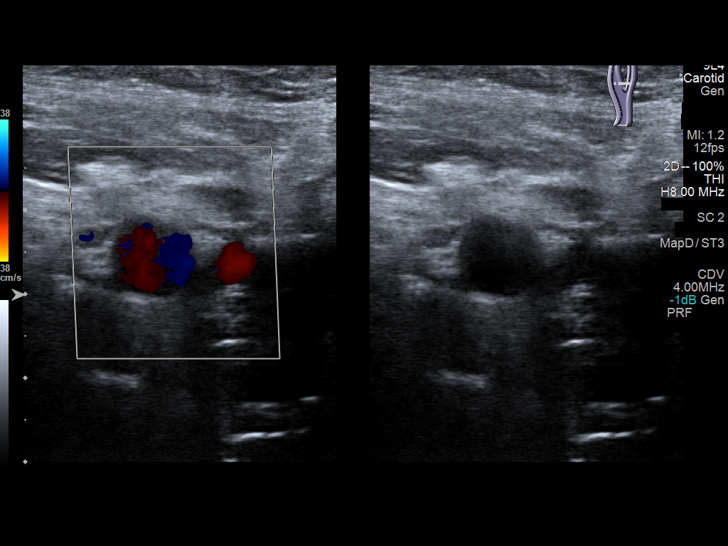
[im 23/65]
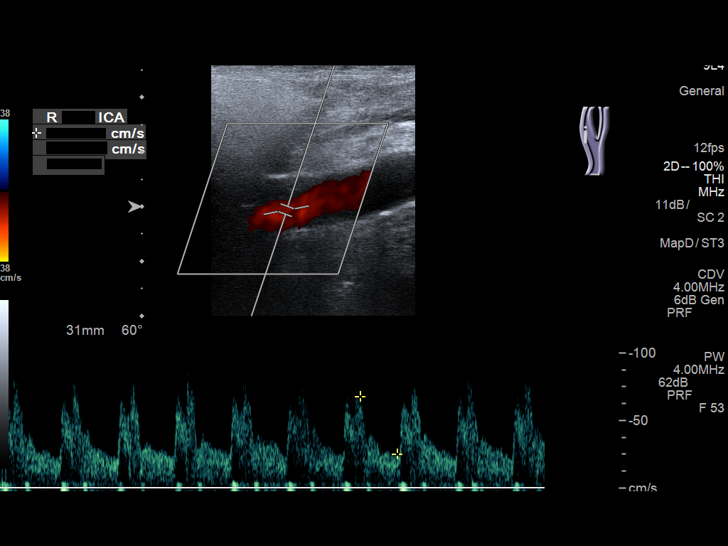
[im 28/65]
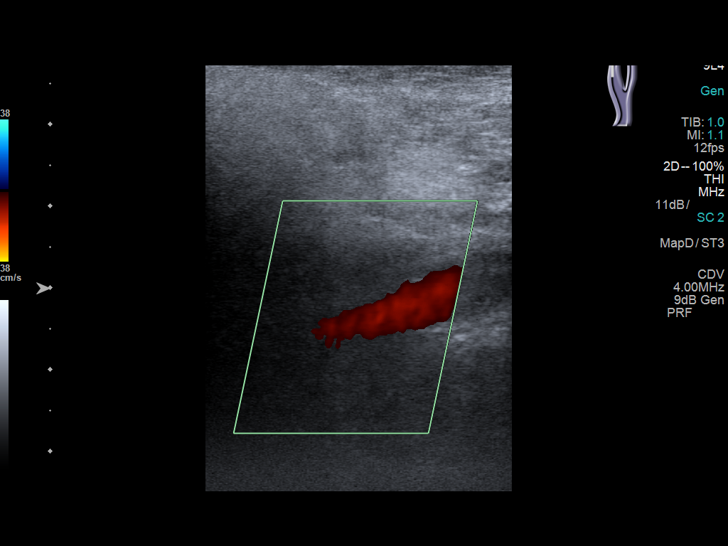
[im 34/65]
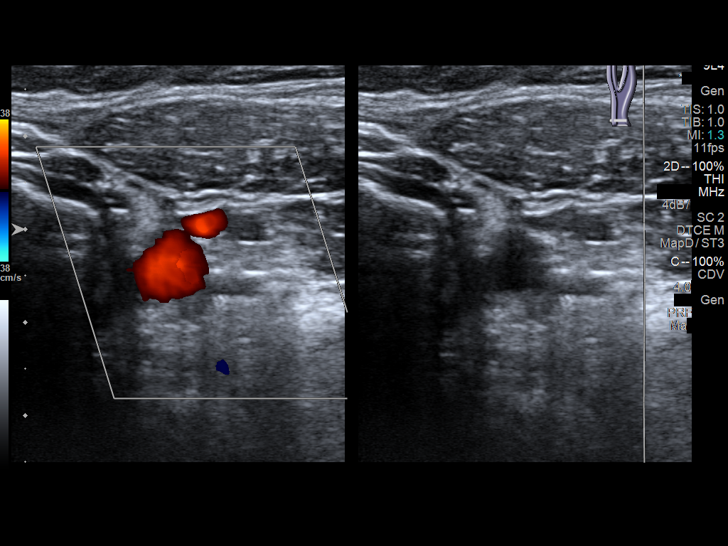
[im 37/65]
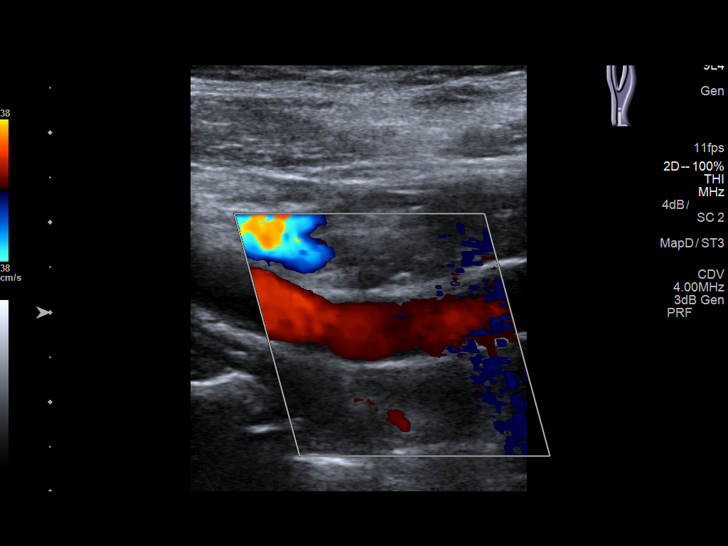
[im 42/65]
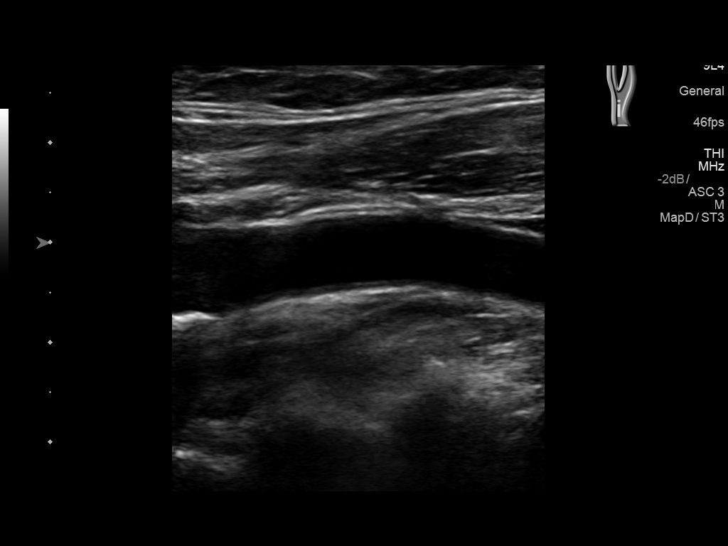
[im 48/65]
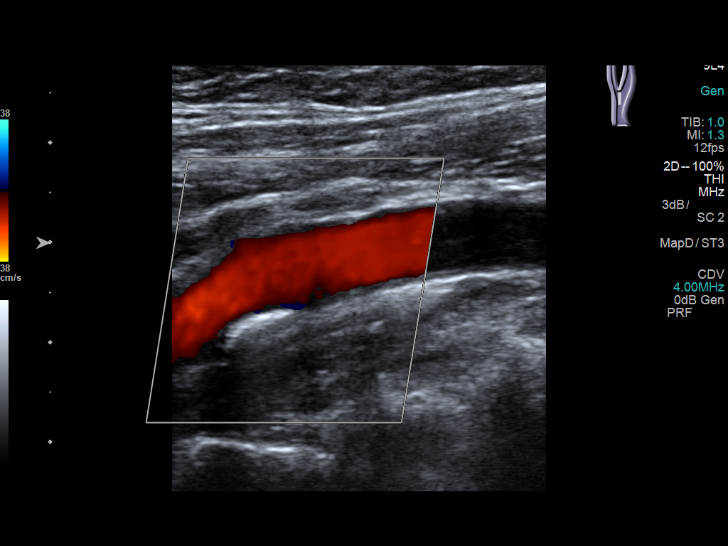
[im 53/65]
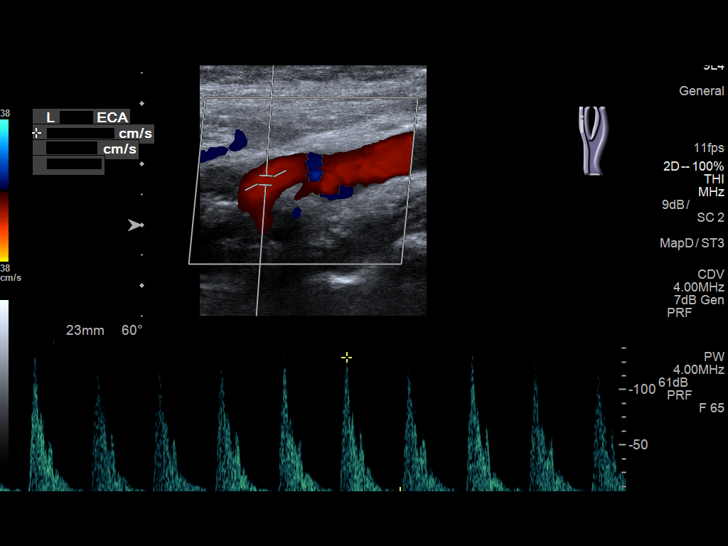
[im 59/65]
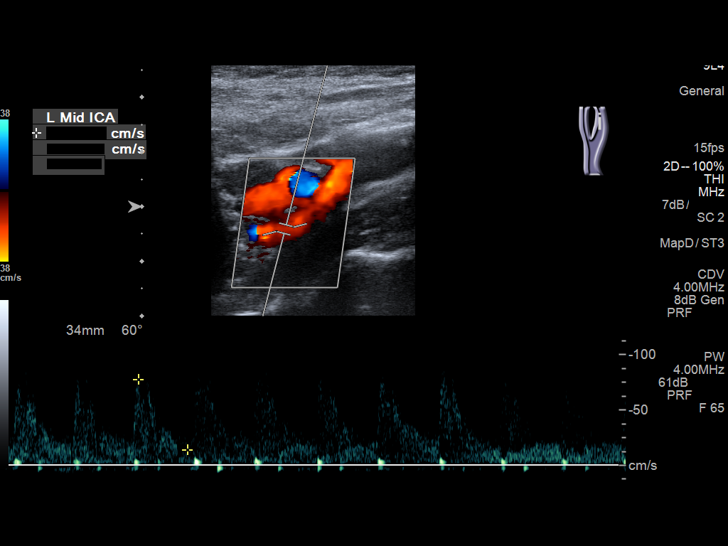
[im 65/65]
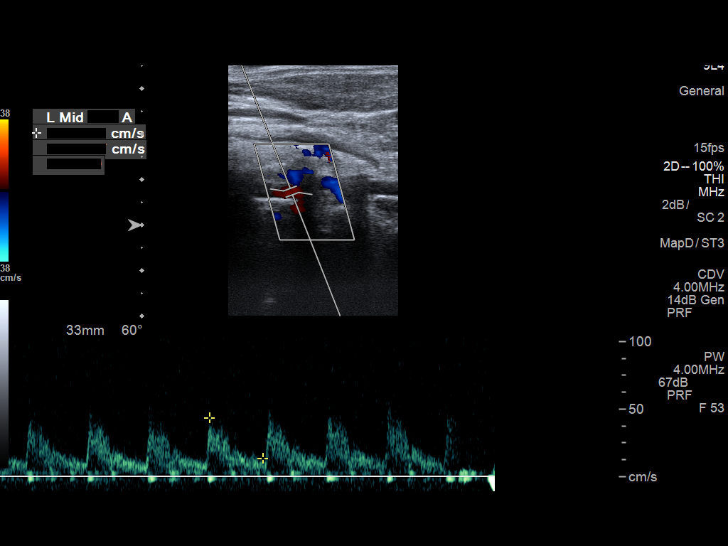

[13 of 24 positions shown; findings below may reference images not displayed]

FINDINGS: Criteria: Quantification of carotid stenosis is based on velocity
parameters that correlate the residual internal carotid diameter
with NASCET-based stenosis levels, using the diameter of the distal
internal carotid lumen as the denominator for stenosis measurement.

The following velocity measurements were obtained:

RIGHT

ICA:  84/34 cm/sec

CCA:  137/24 cm/sec

SYSTOLIC ICA/CCA RATIO:

DIASTOLIC ICA/CCA RATIO:

ECA:  145 cm/sec

LEFT

ICA:  126/31 cm/sec

CCA:  113/20 cm/sec

SYSTOLIC ICA/CCA RATIO:

DIASTOLIC ICA/CCA RATIO:

ECA:  129 cm/sec

RIGHT CAROTID ARTERY: Mild right carotid bifurcation plaque.

RIGHT VERTEBRAL ARTERY:  Patent with antegrade flow.

LEFT CAROTID ARTERY:  Mild left carotid bifurcation plaque .

LEFT VERTEBRAL ARTERY:  Patent antegrade flow.
IMPRESSION: 1. Mild bilateral carotid bifurcation atherosclerotic vascular
plaque. No flow limiting stenosis. Degree of stenosis less than 50%.

2. Vertebral arteries are patent with antegrade flow .

## 2018-01-02 IMAGING — CT CT HEAD W/O CM
3 series · 14 of 46 positions shown, 16 images · non-contrast
Comparison: 06/19/2009

CLINICAL DATA: Code stroke.  Left arm weakness

EXAM:
CT HEAD WITHOUT CONTRAST
TECHNIQUE: Contiguous axial images were obtained from the base of the skull
through the vertex without intravenous contrast.

[Series 2: head wo · axial · 0.47mm/px · z∈[-145,-25]mm · 8 of 29 slices shown, 10 images]
[im 3/29  brain]
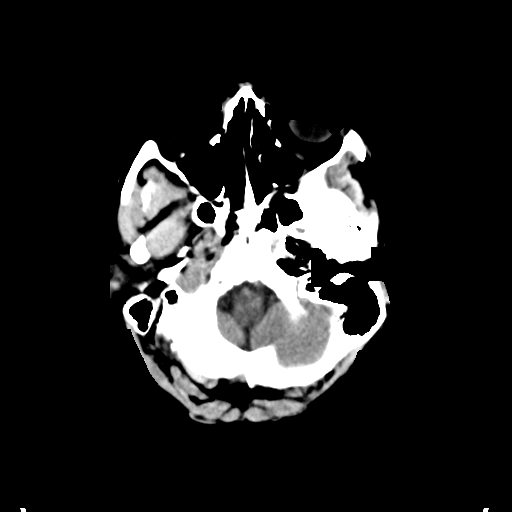
[im 3/29  bone]
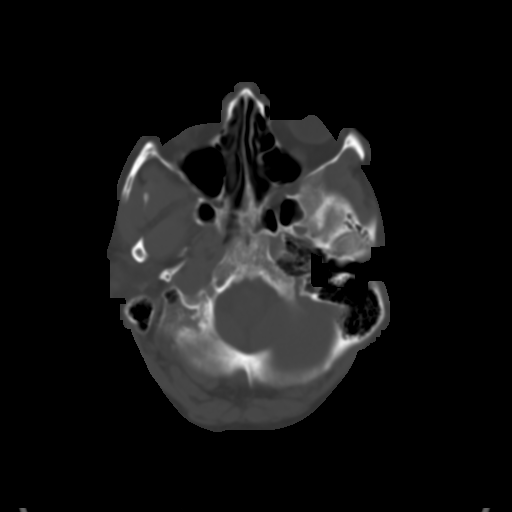
[im 7/29  brain]
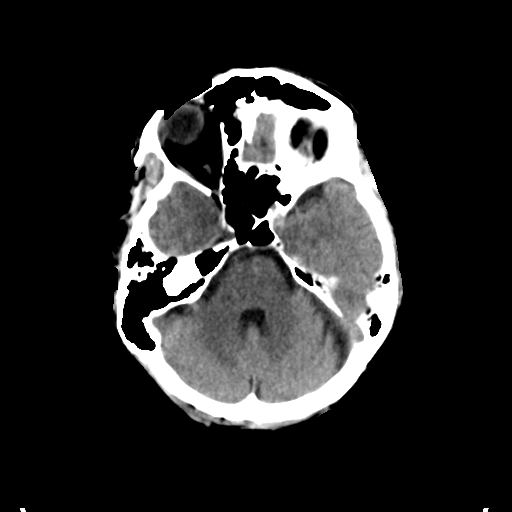
[im 10/29  brain]
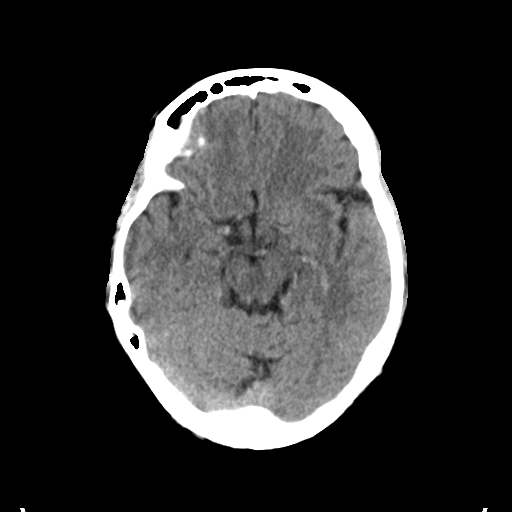
[im 13/29  brain]
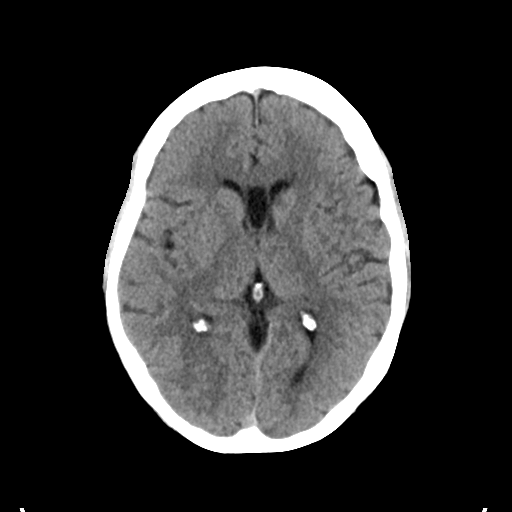
[im 17/29  brain]
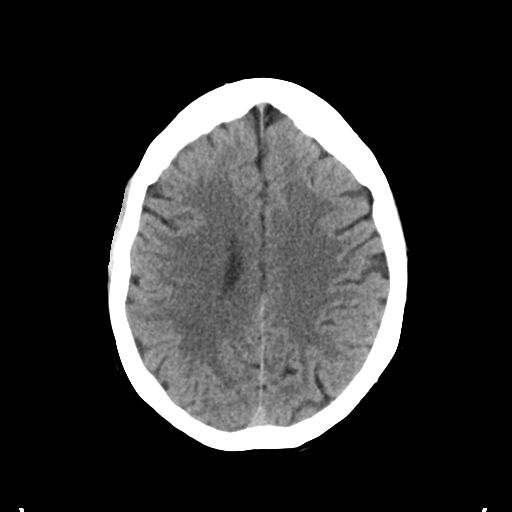
[im 17/29  bone]
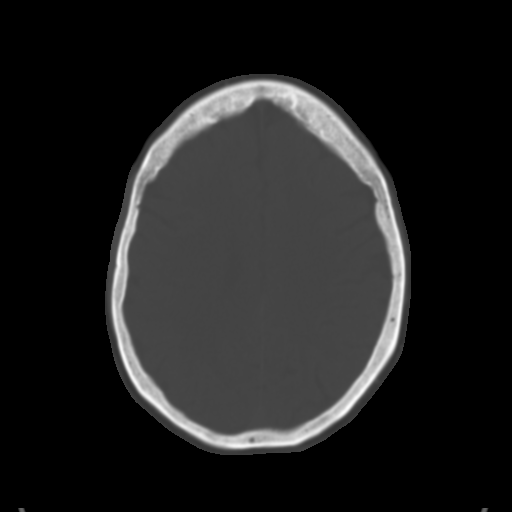
[im 20/29  brain]
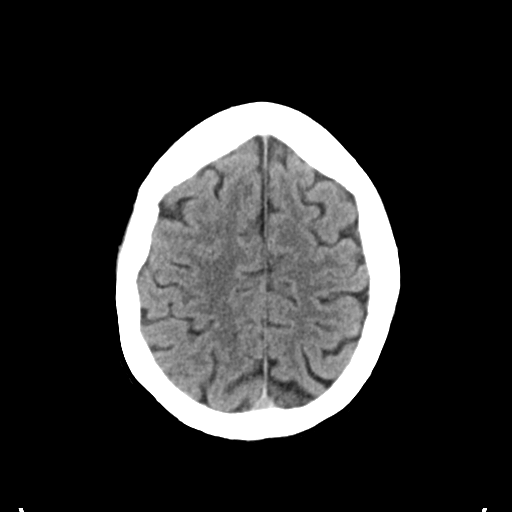
[im 23/29  brain]
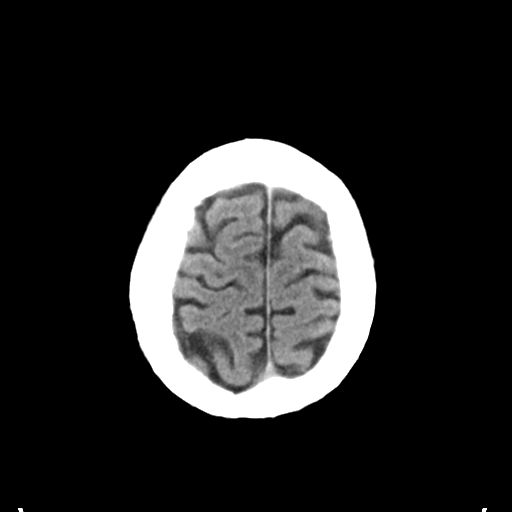
[im 27/29  brain]
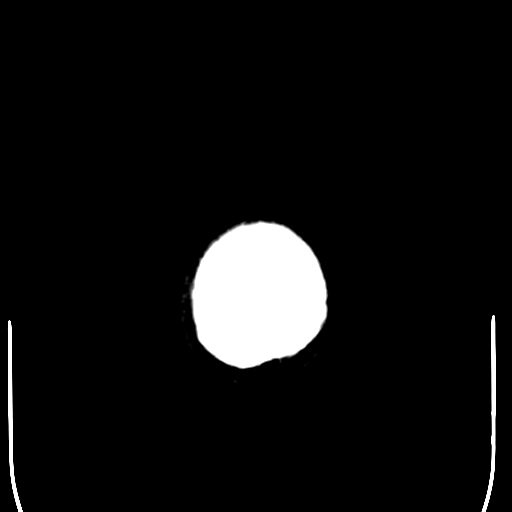

[Series 4: coronal soft tissue · coronal · 0.28mm/px · 3 of 62 slices shown]
[im 21/62  brain]
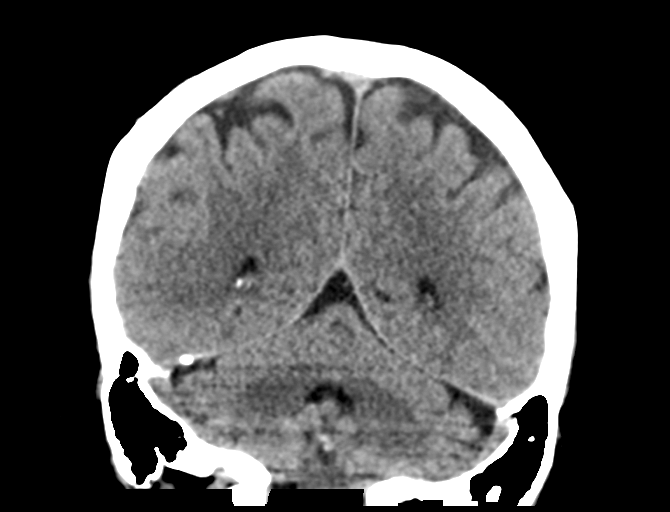
[im 28/62  brain]
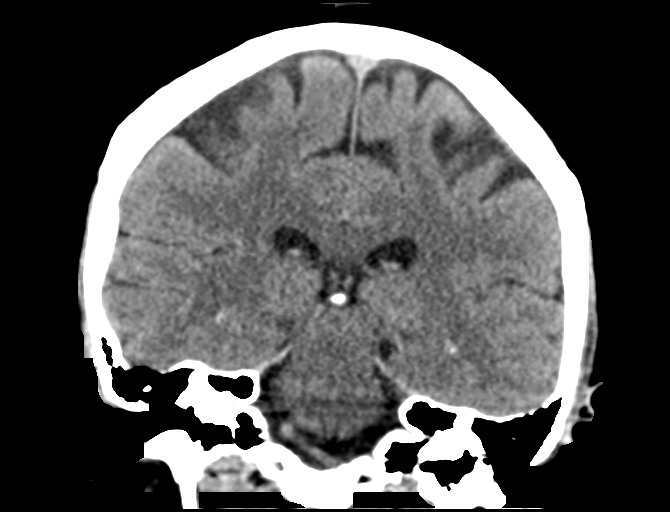
[im 34/62  brain]
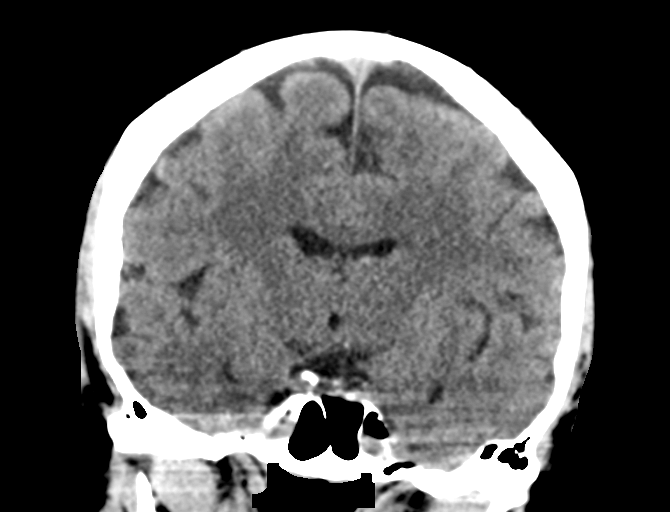

[Series 5: sagittal soft tissue · sagittal · 0.29mm/px · 3 of 49 slices shown]
[im 17/49  brain]
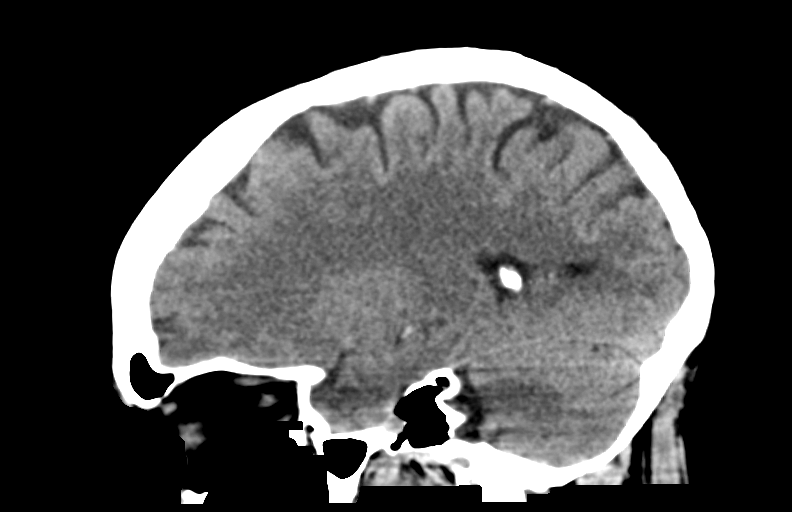
[im 25/49  brain]
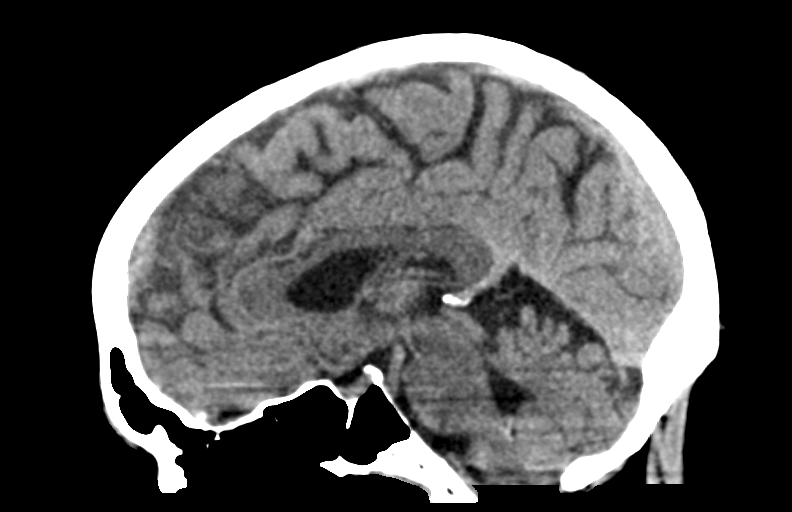
[im 33/49  brain]
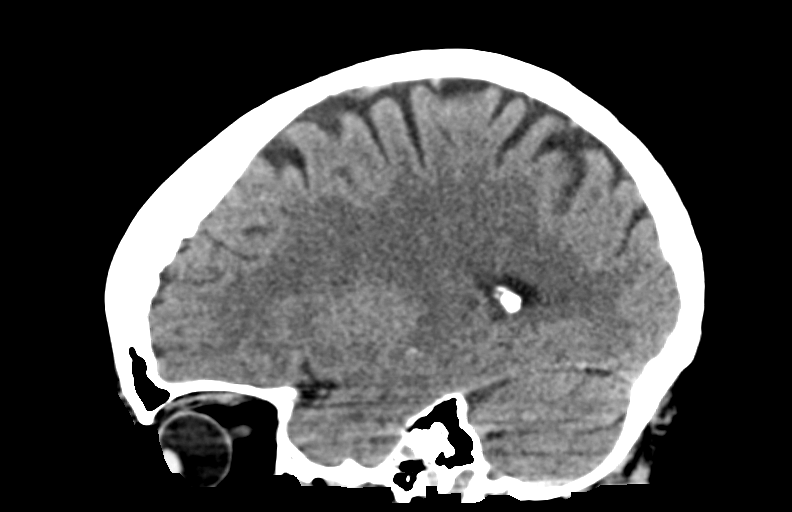

[14 of 46 positions shown; findings below may reference images not displayed]

FINDINGS: Brain: No evidence of acute infarction, hemorrhage, hydrocephalus,
extra-axial collection or mass lesion/mass effect. Mild white matter
disease without detectable progression since prior. Superficial
high-density along the ventral lower pons is attributed artifact
based on reformats.

Vascular: No hyperdense vessel. Atherosclerotic calcification.
Tortuous vertebrobasilar system.

Skull: Negative

Sinuses/Orbits: Negative

Other: These results were called by telephone at the time of
interpretation on 08/22/2016 at [DATE] to Dr. MIHET GUNDEL , who
verbally acknowledged these results.

ASPECTS (Alberta Stroke Program Early CT Score)

- Ganglionic level infarction (caudate, lentiform nuclei, internal
capsule, insula, M1-M3 cortex): 7

- Supraganglionic infarction (M4-M6 cortex): 3

Total score (0-10 with 10 being normal): 10
IMPRESSION: No acute finding. ASPECTS is 10.

## 2020-08-26 ENCOUNTER — Ambulatory Visit (INDEPENDENT_AMBULATORY_CARE_PROVIDER_SITE_OTHER): Payer: Medicare HMO | Admitting: Dermatology

## 2020-08-26 ENCOUNTER — Other Ambulatory Visit: Payer: Self-pay

## 2020-08-26 DIAGNOSIS — L659 Nonscarring hair loss, unspecified: Secondary | ICD-10-CM

## 2020-08-26 NOTE — Patient Instructions (Addendum)
Start Men's Rogaine minoxidil 5 % solution apply to areas of dry scalp twice daily. Possible side effects are facial hair growth   Increase vitamin D 2000 units or 2 tablets daily for 6 weeks.    Telogen Effluvium  Telogen effluvium is a condition in which the body sheds much hair. People describe that they are losing hair "from the roots" in excessive amounts. The hair loss is usually 3 to 6 months following an event. The inciting event may be childbirth, a surgery, an illness with a fever, a traumatic psychological event, weight loss, or the start of a new medication. Some people have no known inciting event. For most people, no treatment is necessary, and the hair will stop falling out and begin to regrow with time. Some women develop a chronic form of telogen effluvium in which they continue to lose hair at an accelerated rate.

## 2020-08-26 NOTE — Progress Notes (Signed)
   New Patient Visit  Subjective  Sophia Taylor is a 65 y.o. female who presents for the following: New Patient (Initial Visit) (Patient is here today for hairloss that started first of December but got worse the first week of January. Patient stated she followed up with pcp and was advised that health related. Pcp told patient that it could be related to covid or stress. Patient states she has tried bioten supplements and shampoos to help with no relief. ).  Objective  Well appearing patient in no apparent distress; mood and affect are within normal limits.  A focused examination was performed including scalp. Relevant physical exam findings are noted in the Assessment and Plan.  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 Possible side effects are facial hair growth   Increase vitamin d to 2000 IU daily.   Will consider PO finasteride or PO minoxidil to treat hair loss if patient's hair loss if not responding to solution.       Other Related Procedures Ferritin  Return in about 3 months (around 11/23/2020) for alopecia follow up.  I, Asher Muir, CMA, am acting as scribe for Darden Dates, MD.  Documentation: I have reviewed the above documentation for accuracy and completeness, and I agree with the above.  Darden Dates, MD

## 2020-08-28 LAB — FERRITIN: Ferritin: 177 ng/mL — ABNORMAL HIGH (ref 15–150)

## 2020-09-02 ENCOUNTER — Telehealth: Payer: Self-pay

## 2020-09-02 NOTE — Telephone Encounter (Signed)
Patient advised iron store ok, no need to take iron supplement, JS

## 2020-09-02 NOTE — Telephone Encounter (Signed)
-----   Message from Sandi Mealy, MD sent at 09/01/2020 10:38 AM EST ----- Iron stores ok. No need to take an iron supplement.

## 2020-09-21 ENCOUNTER — Encounter: Payer: Self-pay | Admitting: Dermatology

## 2020-12-03 ENCOUNTER — Ambulatory Visit: Payer: Medicare HMO | Admitting: Dermatology
